# Patient Record
Sex: Female | Born: 1975 | Race: Black or African American | Hispanic: No | Marital: Single | Smoking: Never smoker
Health system: Southern US, Community
[De-identification: ages and names within clinical notes are randomized; demographics above are authoritative.]

## PROBLEM LIST (undated history)

## (undated) DIAGNOSIS — E119 Type 2 diabetes mellitus without complications: Secondary | ICD-10-CM

## (undated) DIAGNOSIS — N289 Disorder of kidney and ureter, unspecified: Secondary | ICD-10-CM

## (undated) DIAGNOSIS — I10 Essential (primary) hypertension: Secondary | ICD-10-CM

## (undated) HISTORY — PX: OTHER SURGICAL HISTORY: SHX169

---

## 2016-07-15 DIAGNOSIS — I10 Essential (primary) hypertension: Secondary | ICD-10-CM | POA: Diagnosis present

## 2016-11-27 DIAGNOSIS — Z992 Dependence on renal dialysis: Secondary | ICD-10-CM

## 2016-11-27 DIAGNOSIS — N186 End stage renal disease: Secondary | ICD-10-CM

## 2017-07-21 DIAGNOSIS — D631 Anemia in chronic kidney disease: Secondary | ICD-10-CM | POA: Diagnosis present

## 2017-07-21 DIAGNOSIS — N189 Chronic kidney disease, unspecified: Secondary | ICD-10-CM | POA: Diagnosis present

## 2018-08-14 ENCOUNTER — Emergency Department (HOSPITAL_COMMUNITY)
Admission: EM | Admit: 2018-08-14 | Discharge: 2018-08-14 | Disposition: A | Discharge status: Home / Self Care | Payer: Self-pay | Attending: Emergency Medicine | Admitting: Emergency Medicine

## 2018-08-14 ENCOUNTER — Encounter (HOSPITAL_COMMUNITY): Payer: Self-pay | Admitting: Obstetrics and Gynecology

## 2018-08-14 ENCOUNTER — Emergency Department (HOSPITAL_COMMUNITY): Payer: Self-pay

## 2018-08-14 ENCOUNTER — Other Ambulatory Visit: Payer: Self-pay

## 2018-08-14 DIAGNOSIS — Y999 Unspecified external cause status: Secondary | ICD-10-CM | POA: Insufficient documentation

## 2018-08-14 DIAGNOSIS — R0789 Other chest pain: Secondary | ICD-10-CM | POA: Insufficient documentation

## 2018-08-14 DIAGNOSIS — Z992 Dependence on renal dialysis: Secondary | ICD-10-CM | POA: Insufficient documentation

## 2018-08-14 DIAGNOSIS — Y939 Activity, unspecified: Secondary | ICD-10-CM | POA: Insufficient documentation

## 2018-08-14 DIAGNOSIS — Y929 Unspecified place or not applicable: Secondary | ICD-10-CM | POA: Insufficient documentation

## 2018-08-14 DIAGNOSIS — E1122 Type 2 diabetes mellitus with diabetic chronic kidney disease: Secondary | ICD-10-CM | POA: Insufficient documentation

## 2018-08-14 DIAGNOSIS — I120 Hypertensive chronic kidney disease with stage 5 chronic kidney disease or end stage renal disease: Secondary | ICD-10-CM | POA: Insufficient documentation

## 2018-08-14 DIAGNOSIS — M25511 Pain in right shoulder: Secondary | ICD-10-CM | POA: Insufficient documentation

## 2018-08-14 DIAGNOSIS — N186 End stage renal disease: Secondary | ICD-10-CM | POA: Insufficient documentation

## 2018-08-14 DIAGNOSIS — M546 Pain in thoracic spine: Secondary | ICD-10-CM | POA: Insufficient documentation

## 2018-08-14 HISTORY — DX: Essential (primary) hypertension: I10

## 2018-08-14 HISTORY — DX: Type 2 diabetes mellitus without complications: E11.9

## 2018-08-14 HISTORY — DX: Disorder of kidney and ureter, unspecified: N28.9

## 2018-08-14 MED ORDER — LIDOCAINE 5 % EX PTCH: 1.0000 | MEDICATED_PATCH | CUTANEOUS | 0 refills | Status: DC

## 2018-08-14 MED ORDER — METHOCARBAMOL 500 MG PO TABS
500.0000 mg | ORAL_TABLET | Freq: Once | ORAL | Status: AC
  Administered 2018-08-14: 500 mg via ORAL
  Filled 2018-08-14: qty 1

## 2018-08-14 MED ORDER — METHOCARBAMOL 500 MG PO TABS: 500.0000 mg | ORAL_TABLET | Freq: Two times a day (BID) | ORAL | 0 refills | Status: DC

## 2018-08-14 MED ORDER — ACETAMINOPHEN 325 MG PO TABS
650.0000 mg | ORAL_TABLET | Freq: Once | ORAL | Status: AC
  Administered 2018-08-14: 650 mg via ORAL
  Filled 2018-08-14: qty 2

## 2018-08-14 MED ORDER — DICLOFENAC SODIUM 1 % TD GEL: 4.0000 g | Freq: Four times a day (QID) | TRANSDERMAL | 0 refills | Status: DC
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
## 2018-08-14 NOTE — Discharge Instructions (Addendum)
Expect your soreness to increase over the next 2-3 days. Take it easy, but do not lay around too much as this may make any stiffness worse.  Acetaminophen: May take acetaminophen (generic for Tylenol), as needed, for pain. Your daily total maximum amount of acetaminophen from all sources should be limited to 4000mg /day for persons without liver problems, or 2000mg /day for those with liver problems. Diclofenac gel: This topical anti-inflammatory medication may be used to provide localized pain relief. Muscle relaxer: Robaxin is a muscle relaxer and may help loosen stiff muscles. Do not take the Robaxin while driving or performing other dangerous activities.  Lidocaine patches: These are available via either prescription or over-the-counter. The over-the-counter option may be more economical one and are likely just as effective. There are multiple over-the-counter brands, such as Salonpas. Exercises: Be sure to perform the attached exercises starting with three times a week and working up to performing them daily. This is an essential part of preventing long term problems.  Follow up: Follow up with a primary care provider for any future management of these complaints. Be sure to follow up within 7-10 days. Return: Return to the ED should symptoms worsen.  For prescription assistance, may try using prescription discount sites or apps, such as goodrx.com

## 2018-10-14 DIAGNOSIS — E662 Morbid (severe) obesity with alveolar hypoventilation: Secondary | ICD-10-CM | POA: Diagnosis present

## 2018-10-14 DIAGNOSIS — G4733 Obstructive sleep apnea (adult) (pediatric): Secondary | ICD-10-CM | POA: Diagnosis present

## 2018-10-14 DIAGNOSIS — E11319 Type 2 diabetes mellitus with unspecified diabetic retinopathy without macular edema: Secondary | ICD-10-CM | POA: Diagnosis present

## 2019-08-19 DIAGNOSIS — E875 Hyperkalemia: Secondary | ICD-10-CM | POA: Diagnosis present

## 2019-09-03 DIAGNOSIS — G9341 Metabolic encephalopathy: Secondary | ICD-10-CM | POA: Diagnosis present

## 2019-09-03 DIAGNOSIS — J9601 Acute respiratory failure with hypoxia: Secondary | ICD-10-CM | POA: Diagnosis present

## 2019-09-03 DIAGNOSIS — J9602 Acute respiratory failure with hypercapnia: Secondary | ICD-10-CM | POA: Diagnosis present

## 2020-03-18 ENCOUNTER — Emergency Department (HOSPITAL_COMMUNITY)
Admission: EM | Admit: 2020-03-18 | Discharge: 2020-03-18 | Disposition: A | Discharge status: Home / Self Care | Payer: BC Managed Care – PPO | Attending: Emergency Medicine | Admitting: Emergency Medicine

## 2020-03-18 ENCOUNTER — Emergency Department (HOSPITAL_COMMUNITY): Payer: BC Managed Care – PPO

## 2020-03-18 ENCOUNTER — Other Ambulatory Visit: Payer: Self-pay

## 2020-03-18 DIAGNOSIS — I120 Hypertensive chronic kidney disease with stage 5 chronic kidney disease or end stage renal disease: Secondary | ICD-10-CM | POA: Diagnosis not present

## 2020-03-18 DIAGNOSIS — E1122 Type 2 diabetes mellitus with diabetic chronic kidney disease: Secondary | ICD-10-CM | POA: Insufficient documentation

## 2020-03-18 DIAGNOSIS — Z992 Dependence on renal dialysis: Secondary | ICD-10-CM | POA: Diagnosis not present

## 2020-03-18 DIAGNOSIS — Z79899 Other long term (current) drug therapy: Secondary | ICD-10-CM | POA: Diagnosis not present

## 2020-03-18 DIAGNOSIS — Z794 Long term (current) use of insulin: Secondary | ICD-10-CM | POA: Diagnosis not present

## 2020-03-18 DIAGNOSIS — R0789 Other chest pain: Secondary | ICD-10-CM | POA: Diagnosis present

## 2020-03-18 DIAGNOSIS — N186 End stage renal disease: Secondary | ICD-10-CM | POA: Diagnosis not present

## 2020-03-18 DIAGNOSIS — R079 Chest pain, unspecified: Secondary | ICD-10-CM

## 2020-03-18 LAB — I-STAT CHEM 8, ED
BUN: 54 mg/dL — ABNORMAL HIGH (ref 6–20)
Calcium, Ion: 1.06 mmol/L — ABNORMAL LOW (ref 1.15–1.40)
Chloride: 99 mmol/L (ref 98–111)
Creatinine, Ser: 5.8 mg/dL — ABNORMAL HIGH (ref 0.44–1.00)
Glucose, Bld: 120 mg/dL — ABNORMAL HIGH (ref 70–99)
HCT: 42 % (ref 36.0–46.0)
Hemoglobin: 14.3 g/dL (ref 12.0–15.0)
Potassium: 5 mmol/L (ref 3.5–5.1)
Sodium: 140 mmol/L (ref 135–145)
TCO2: 32 mmol/L (ref 22–32)

## 2020-03-18 LAB — CBC WITH DIFFERENTIAL/PLATELET
Abs Immature Granulocytes: 0.04 10*3/uL (ref 0.00–0.07)
Basophils Absolute: 0.1 10*3/uL (ref 0.0–0.1)
Basophils Relative: 1 %
Eosinophils Absolute: 0.2 10*3/uL (ref 0.0–0.5)
Eosinophils Relative: 2 %
HCT: 40.7 % (ref 36.0–46.0)
Hemoglobin: 11.9 g/dL — ABNORMAL LOW (ref 12.0–15.0)
Immature Granulocytes: 1 %
Lymphocytes Relative: 15 %
Lymphs Abs: 1.3 10*3/uL (ref 0.7–4.0)
MCH: 27.4 pg (ref 26.0–34.0)
MCHC: 29.2 g/dL — ABNORMAL LOW (ref 30.0–36.0)
MCV: 93.8 fL (ref 80.0–100.0)
Monocytes Absolute: 0.5 10*3/uL (ref 0.1–1.0)
Monocytes Relative: 6 %
Neutro Abs: 6.7 10*3/uL (ref 1.7–7.7)
Neutrophils Relative %: 75 %
Platelets: 241 10*3/uL (ref 150–400)
RBC: 4.34 MIL/uL (ref 3.87–5.11)
RDW: 16.7 % — ABNORMAL HIGH (ref 11.5–15.5)
WBC: 8.9 10*3/uL (ref 4.0–10.5)
nRBC: 0 % (ref 0.0–0.2)

## 2020-03-18 LAB — COMPREHENSIVE METABOLIC PANEL
ALT: 18 U/L (ref 0–44)
AST: 28 U/L (ref 15–41)
Albumin: 4.2 g/dL (ref 3.5–5.0)
Alkaline Phosphatase: 94 U/L (ref 38–126)
Anion gap: 15 (ref 5–15)
BUN: 39 mg/dL — ABNORMAL HIGH (ref 6–20)
CO2: 28 mmol/L (ref 22–32)
Calcium: 8.7 mg/dL — ABNORMAL LOW (ref 8.9–10.3)
Chloride: 97 mmol/L — ABNORMAL LOW (ref 98–111)
Creatinine, Ser: 5.6 mg/dL — ABNORMAL HIGH (ref 0.44–1.00)
GFR calc Af Amer: 10 mL/min — ABNORMAL LOW (ref >60)
GFR calc non Af Amer: 8 mL/min — ABNORMAL LOW (ref >60)
Glucose, Bld: 123 mg/dL — ABNORMAL HIGH (ref 70–99)
Potassium: 4.7 mmol/L (ref 3.5–5.1)
Sodium: 140 mmol/L (ref 135–145)
Total Bilirubin: 0.6 mg/dL (ref 0.3–1.2)
Total Protein: 8.6 g/dL — ABNORMAL HIGH (ref 6.5–8.1)

## 2020-03-18 LAB — TROPONIN I (HIGH SENSITIVITY)
Troponin I (High Sensitivity): 8 ng/L (ref <18)
Troponin I (High Sensitivity): 9 ng/L (ref <18)

## 2020-03-18 MED ORDER — ASPIRIN 325 MG PO TABS
325.0000 mg | ORAL_TABLET | Freq: Once | ORAL | Status: AC
  Administered 2020-03-18: 325 mg via ORAL
  Filled 2020-03-18: qty 1

## 2020-03-18 MED ORDER — SODIUM CHLORIDE 0.9 % IV BOLUS
500.0000 mL | Freq: Once | INTRAVENOUS | Status: AC
  Administered 2020-03-18: 500 mL via INTRAVENOUS

## 2020-03-18 NOTE — Discharge Instructions (Signed)
Take Tylenol as needed for chest pain   You may have a reaction to iron infusion   Your heart enzymes are normal right now   See your doctor  Return to ER if you have worse chest pain, trouble breathing, fever.

## 2020-03-18 NOTE — ED Notes (Signed)
ED Provider at bedside. 

## 2020-03-18 NOTE — ED Triage Notes (Signed)
Per patient she developed chest pain starting 1 hour ago after dialysis. Pain is 10/10

## 2020-03-18 NOTE — ED Provider Notes (Signed)
[facility] DEPT Provider Note   CSN: [id number] Arrival date & time: 03/18/20  1708     History Chief Complaint  Patient presents with  . Chest Pain    Autumn Mccormick is a 44 y.o. female hx of DM, HTN, ESRD on dialysis here presenting with chest pain.  Patient just finished dialysis prior to arrival.  She states that she also received iron transfusion during that time.  Patient states that she got home around 4 PM and around 430, she had left-sided chest pain.  States that the pain is constant and did not go away so she came to the ER .  Patient was noted to be hypotensive in triage.  Patient denies any fevers or chills or cough.  Patient denies any history of MIs in the past.  She states that she had family history of MI.   The history is provided by the patient.       Past Medical History:  Diagnosis Date  . Diabetes mellitus without complication ([place])   . Hypertension   . Renal disorder     There are no problems to display for this patient.   Past Surgical History:  Procedure Laterality Date  . kidney stent Right      OB History    Gravida      Para      Term      Preterm      AB      Living  1     SAB      TAB      Ectopic      Multiple      Live Births              No family history on file.  Social History   Tobacco Use  . Smoking status: Never Smoker  . Smokeless tobacco: Never Used  Substance Use Topics  . Alcohol use: Yes    Comment: Socially  . Drug use: Yes    Types: Marijuana    Home Medications Prior to Admission medications   Medication Sig Start Date End Date Taking? Authorizing Provider  amitriptyline (ELAVIL) 25 MG tablet Take 25 mg by mouth at bedtime. 01/07/20  Yes [provider]  atorvastatin (LIPITOR) 20 MG tablet Take 20 mg by mouth every evening.  01/07/20  Yes [provider]  carvedilol (COREG) 25 MG tablet Take 25 mg by mouth 2 (two) times daily. 03/08/20  Yes  [provider]  glipiZIDE (GLUCOTROL XL) 5 MG 24 hr tablet Take 5 mg by mouth daily. 01/07/20  Yes [provider]  LEVEMIR FLEXTOUCH 100 UNIT/ML FlexPen Inject 40 Units into the skin at bedtime.  03/07/20  Yes [provider]  losartan (COZAAR) 50 MG tablet Take 50 mg by mouth daily. 01/07/20  Yes [provider]  Multiple Vitamins-Minerals (MULTIVITAMIN ADULTS PO) Take 1 tablet by mouth every evening.   Yes [provider]  omeprazole (PRILOSEC) 20 MG capsule Take 20 mg by mouth daily as needed (indigestion).  03/07/20  Yes [provider]  sevelamer carbonate (RENVELA) 800 MG tablet Take 1,600 mg by mouth 3 (three) times daily with meals.  02/23/20  Yes [provider]  diclofenac sodium (VOLTAREN) 1 % GEL Apply 4 g topically 4 (four) times daily. Patient not taking: Reported on 03/18/2020 08/14/18   Joy, Raquel Sarna C, PA-C  lidocaine (LIDODERM) 5 % Place 1 patch onto the skin daily. Remove & Discard patch within  12 hours or as directed by MD Patient not taking: Reported on 03/18/2020 08/14/18   Joy, Helane Gunther, PA-C  methocarbamol (ROBAXIN) 500 MG tablet Take 1 tablet (500 mg total) by mouth 2 (two) times daily. Patient not taking: Reported on 03/18/2020 08/14/18   Lorayne Bender, PA-C    Allergies    Augmentin [amoxicillin-pot clavulanate] and Metformin and related  Review of Systems   Review of Systems  Cardiovascular: Positive for chest pain.  All other systems reviewed and are negative.   Physical Exam Updated Vital Signs BP 125/69   Pulse 71   Temp 98.2 F (36.8 C) (Oral)   Resp 15   Ht 5\' 5"  (1.651 m)   Wt (!) 146.1 kg   SpO2 100%   BMI 53.58 kg/m   Physical Exam Vitals and nursing note reviewed.  HENT:     Head: Normocephalic.  Eyes:     Extraocular Movements: Extraocular movements intact.     Pupils: Pupils are equal, round, and reactive to light.  Cardiovascular:     Rate and Rhythm: Normal rate and regular rhythm.      Heart sounds: Normal heart sounds.  Pulmonary:     Effort: Pulmonary effort is normal.  Abdominal:     General: Bowel sounds are normal.     Palpations: Abdomen is soft.  Musculoskeletal:        General: Normal range of motion.     Cervical back: Normal range of motion and neck supple.  Skin:    General: Skin is warm.     Capillary Refill: Capillary refill takes less than 2 seconds.  Neurological:     General: No focal deficit present.     Mental Status: She is alert and oriented to person, place, and time.  Psychiatric:        Mood and Affect: Mood normal.        Behavior: Behavior normal.     ED Results / Procedures / Treatments   Labs (all labs ordered are listed, but only abnormal results are displayed) Labs Reviewed  CBC WITH DIFFERENTIAL/PLATELET - Abnormal; Notable for the following components:      Result Value   Hemoglobin 11.9 (*)    MCHC 29.2 (*)    RDW 16.7 (*)    All other components within normal limits  COMPREHENSIVE METABOLIC PANEL - Abnormal; Notable for the following components:   Chloride 97 (*)    Glucose, Bld 123 (*)    BUN 39 (*)    Creatinine, Ser 5.60 (*)    Calcium 8.7 (*)    Total Protein 8.6 (*)    GFR calc non Af Amer 8 (*)    GFR calc Af Amer 10 (*)    All other components within normal limits  I-STAT CHEM 8, ED - Abnormal; Notable for the following components:   BUN 54 (*)    Creatinine, Ser 5.80 (*)    Glucose, Bld 120 (*)    Calcium, Ion 1.06 (*)    All other components within normal limits  TROPONIN I (HIGH SENSITIVITY)  TROPONIN I (HIGH SENSITIVITY)    EKG EKG Interpretation  Date/Time:  Saturday March 18 2020 17:28:34 EDT Ventricular Rate:  74 PR Interval:    QRS Duration: 81 QT Interval:  412 QTC Calculation: 458 R Axis:   74 Text Interpretation: Sinus rhythm Low voltage, extremity and precordial leads ST elev, probable normal early repol pattern 12 Lead; [place] No previous ECGs available Confirmed by Darl Householder,  Fenton Malling  ([id number]) on 03/18/2020 6:07:53 PM   Radiology DG Chest Port 1 View  Result Date: 03/18/2020 CLINICAL DATA:  Left upper quadrant pain and chest pain. EXAM: PORTABLE CHEST 1 VIEW COMPARISON:  September 02, 2019 FINDINGS: Very mild linear atelectasis is seen along the lateral aspect of the left lung base. There is no evidence of acute infiltrate, pleural effusion or pneumothorax. The cardiac silhouette is mildly enlarged and unchanged in size. The visualized skeletal structures are unremarkable. IMPRESSION: No active disease. Electronically Signed   By: Virgina Norfolk M.D.   On: 03/18/2020 18:50    Procedures Procedures (including critical care time)  Medications Ordered in ED Medications  sodium chloride 0.9 % bolus 500 mL (0 mLs Intravenous Stopped 03/18/20 1947)  aspirin tablet 325 mg (325 mg Oral Given 03/18/20 1909)    ED Course  I have reviewed the triage vital signs and the nursing notes.  Pertinent labs & imaging results that were available during my care of the patient were reviewed by me and considered in my medical decision making (see chart for details).    MDM Rules/Calculators/A&P                      Autumn Mccormick is a 44 y.o. female who presenting with chest pain.  Patient is a dialysis patient and just finished dialysis.  Patient was hypotensive in triage and that is because she just had dialysis.  Patient has no personal history of CAD but has family member who had MI in the past.  Will get CBC, CMP, troponin x2, chest x-ray. I doubt PE or dissection.  9:41 PM Labs at baseline. Trop neg x 2. BP improved to 125/69.  Stable for discharge at this point.  Final Clinical Impression(s) / ED Diagnoses Final diagnoses:  None    Rx / DC Orders ED Discharge Orders    None       Drenda Freeze, MD 03/18/20 2142

## 2021-03-02 ENCOUNTER — Emergency Department (HOSPITAL_COMMUNITY)
Admission: EM | Admit: 2021-03-02 | Discharge: 2021-03-02 | Disposition: A | Discharge status: Home / Self Care | Payer: Medicare Other | Source: Home / Self Care | Attending: Emergency Medicine | Admitting: Emergency Medicine

## 2021-03-02 ENCOUNTER — Emergency Department (HOSPITAL_COMMUNITY): Payer: Medicare Other

## 2021-03-02 ENCOUNTER — Other Ambulatory Visit: Payer: Self-pay

## 2021-03-02 DIAGNOSIS — N3000 Acute cystitis without hematuria: Secondary | ICD-10-CM

## 2021-03-02 DIAGNOSIS — E875 Hyperkalemia: Secondary | ICD-10-CM | POA: Diagnosis not present

## 2021-03-02 DIAGNOSIS — R1032 Left lower quadrant pain: Secondary | ICD-10-CM | POA: Insufficient documentation

## 2021-03-02 DIAGNOSIS — G9341 Metabolic encephalopathy: Secondary | ICD-10-CM | POA: Diagnosis not present

## 2021-03-02 DIAGNOSIS — R110 Nausea: Secondary | ICD-10-CM | POA: Insufficient documentation

## 2021-03-02 DIAGNOSIS — N12 Tubulo-interstitial nephritis, not specified as acute or chronic: Secondary | ICD-10-CM

## 2021-03-02 DIAGNOSIS — L0291 Cutaneous abscess, unspecified: Secondary | ICD-10-CM

## 2021-03-02 LAB — CBC WITH DIFFERENTIAL/PLATELET
Abs Immature Granulocytes: 0.04 10*3/uL (ref 0.00–0.07)
Basophils Absolute: 0 10*3/uL (ref 0.0–0.1)
Basophils Relative: 0 %
Eosinophils Absolute: 0.1 10*3/uL (ref 0.0–0.5)
Eosinophils Relative: 1 %
HCT: 37.9 % (ref 36.0–46.0)
Hemoglobin: 11.2 g/dL — ABNORMAL LOW (ref 12.0–15.0)
Immature Granulocytes: 0 %
Lymphocytes Relative: 8 %
Lymphs Abs: 0.7 10*3/uL (ref 0.7–4.0)
MCH: 27.3 pg (ref 26.0–34.0)
MCHC: 29.6 g/dL — ABNORMAL LOW (ref 30.0–36.0)
MCV: 92.4 fL (ref 80.0–100.0)
Monocytes Absolute: 0.5 10*3/uL (ref 0.1–1.0)
Monocytes Relative: 5 %
Neutro Abs: 8.3 10*3/uL — ABNORMAL HIGH (ref 1.7–7.7)
Neutrophils Relative %: 86 %
Platelets: 244 10*3/uL (ref 150–400)
RBC: 4.1 MIL/uL (ref 3.87–5.11)
RDW: 16.1 % — ABNORMAL HIGH (ref 11.5–15.5)
WBC: 9.7 10*3/uL (ref 4.0–10.5)
nRBC: 0 % (ref 0.0–0.2)

## 2021-03-02 LAB — COMPREHENSIVE METABOLIC PANEL
ALT: 15 U/L (ref 0–44)
AST: 16 U/L (ref 15–41)
Albumin: 3.8 g/dL (ref 3.5–5.0)
Alkaline Phosphatase: 80 U/L (ref 38–126)
Anion gap: 14 (ref 5–15)
BUN: 93 mg/dL — ABNORMAL HIGH (ref 6–20)
CO2: 27 mmol/L (ref 22–32)
Calcium: 9.1 mg/dL (ref 8.9–10.3)
Chloride: 97 mmol/L — ABNORMAL LOW (ref 98–111)
Creatinine, Ser: 10.73 mg/dL — ABNORMAL HIGH (ref 0.44–1.00)
GFR, Estimated: 4 mL/min — ABNORMAL LOW (ref >60)
Glucose, Bld: 168 mg/dL — ABNORMAL HIGH (ref 70–99)
Potassium: 5.1 mmol/L (ref 3.5–5.1)
Sodium: 138 mmol/L (ref 135–145)
Total Bilirubin: 0.3 mg/dL (ref 0.3–1.2)
Total Protein: 8.1 g/dL (ref 6.5–8.1)

## 2021-03-02 LAB — URINALYSIS, ROUTINE W REFLEX MICROSCOPIC
Bilirubin Urine: NEGATIVE
Glucose, UA: 50 mg/dL — AB
Ketones, ur: NEGATIVE mg/dL
Nitrite: NEGATIVE
Protein, ur: 100 mg/dL — AB
Specific Gravity, Urine: 1.01 (ref 1.005–1.030)
WBC, UA: >50 WBC/hpf — ABNORMAL HIGH (ref 0–5)
pH: 7 (ref 5.0–8.0)

## 2021-03-02 LAB — LIPASE, BLOOD: Lipase: 39 U/L (ref 11–51)

## 2021-03-02 MED ORDER — CEFIXIME 100 MG/5ML PO SUSR: 200.0000 mg | Freq: Every day | ORAL | 0 refills | Status: DC

## 2021-03-02 MED ORDER — LABETALOL HCL 5 MG/ML IV SOLN
10.0000 mg | Freq: Once | INTRAVENOUS | Status: AC
  Administered 2021-03-02: 10 mg via INTRAVENOUS
  Filled 2021-03-02: qty 4

## 2021-03-02 MED ORDER — ONDANSETRON 4 MG PO TBDP: 4.0000 mg | ORAL_TABLET | Freq: Three times a day (TID) | ORAL | 0 refills | Status: AC | PRN

## 2021-03-02 MED ORDER — ONDANSETRON HCL 4 MG/2ML IJ SOLN
4.0000 mg | Freq: Once | INTRAMUSCULAR | Status: AC
  Administered 2021-03-02: 4 mg via INTRAVENOUS
  Filled 2021-03-02: qty 2

## 2021-03-02 MED ORDER — HYDROCODONE-ACETAMINOPHEN 5-325 MG PO TABS
2.0000 | ORAL_TABLET | Freq: Once | ORAL | Status: AC
  Administered 2021-03-02: 2 via ORAL
  Filled 2021-03-02: qty 2

## 2021-03-02 MED ORDER — MORPHINE SULFATE (PF) 4 MG/ML IV SOLN
4.0000 mg | Freq: Once | INTRAVENOUS | Status: AC
  Administered 2021-03-02: 4 mg via INTRAVENOUS
  Filled 2021-03-02: qty 1

## 2021-03-02 MED ORDER — SODIUM CHLORIDE 0.9 % IV SOLN
2.0000 g | Freq: Once | INTRAVENOUS | Status: AC
  Administered 2021-03-02: 2 g via INTRAVENOUS
  Filled 2021-03-02: qty 20

## 2021-03-02 MED ORDER — HYDROCODONE-ACETAMINOPHEN 5-325 MG PO TABS: 1.0000 | ORAL_TABLET | Freq: Four times a day (QID) | ORAL | 0 refills | Status: DC | PRN

## 2021-03-02 NOTE — ED Provider Notes (Signed)
I provided a substantive portion of the care of this patient.  I personally performed the entirety of the exam for this encounter.    Patient is Tuesday Thursday Saturday dialysis but makes urine.  Patient reports she had a pretty sudden onset of a sharp pain in her left very lower abdomen that started in the morning.  She reports she has never had a kidney stone.  She indicates the pain is right along her inguinal lower abdominal area.  She reports is really sharp and persistent.  No radiation into the leg.  No weakness or numbness into the leg.  She did get nauseated and threw up 3 times.  No diarrhea.  No fever.  Patient is being evaluated for peritoneal dialysis access but has not had any intra-abdominal instrumentation.  He does have a formed and mature fistula in the left upper arm.  Patient denies prior history of similar pain.  Patient reports she has had a hysterectomy but does have her ovaries still.  She denies prior history of known ovarian cysts or other complications.  Patient is alert appropriate no respiratory distress.  Lungs are clear.  Heart regular.  Patient endorses discomfort to palpation in the very lower left abdomen down to about the suprapubic area.  No upper tenderness.  No central abdominal pain.  No rashes or skin changes.  No weakness of the lower extremities.  No swelling of the lower extremities.  Agree with proceeding with CT scan to rule out kidney stone or other potential etiologies such as diverticulitis or ovarian cyst.  CT does not show acute surgical issues.  It does suggest slight fullness of the left collecting system without an obvious obstruction..  Agree with proceeding to ultrasound to assess for possible ovarian torsion although she does not have uterus.  Very low possibility for PID or STD as etiology of pain.  No specific findings on ultrasound.  At this time urinalysis is suggestive of UTI.  Patient does continue to make urine several times a day.  CT shows a  little bit of fullness which may suggest either early pyelonephritis or possibly recently passed stone.  At this time agree with plan to proceed with antibiotics and close follow-up.   Charlesetta Shanks, MD 03/03/21 1406

## 2021-03-02 NOTE — ED Triage Notes (Signed)
Brought in by EMS from home c/o LLQ abdominal pain started last night. Pt took tylenol and gas relief but no relief. Pt report nausea and emesis x3 since last night. Pt a/ox4.   BP 140 87 HR 90 RR 20 Temp 98.7

## 2021-03-02 NOTE — ED Notes (Signed)
PT O2 low Nurse made aware

## 2021-03-02 NOTE — ED Provider Notes (Signed)
[facility] DEPT Provider Note   CSN: [id number] Arrival date & time: 03/02/21  [id number]     History Chief Complaint  Patient presents with  . Abdominal Pain    Autumn Mccormick is a 45 y.o. female.  HPI   Patient presents with left flank pain x 6 hours. States it occurred suddenly, pain is sharp and constant. Does not radiate. Is worsened by movement and pressure. Is not alleviated by tylenol or gas x. She has had pain like this before and it was determined to be gas. Associated with nausea and vomiting (3x, no hematemesis). No dysuria, no hematuria, no fever. No history of kidney stones. She is on dialysis T, TH, S. She still makes urine 3x daily. She had an appointment to consider at home dialysis but has not had a catheter placed. Her fistula is in the left arm.   She notes she has had an abscess under her left breast x 1 week. It has been draining pus and blood.   Past Medical History:  Diagnosis Date  . Diabetes mellitus without complication ([place])   . Hypertension   . Renal disorder     There are no problems to display for this patient.   Past Surgical History:  Procedure Laterality Date  . kidney stent Right      OB History    Gravida      Para      Term      Preterm      AB      Living  1     SAB      IAB      Ectopic      Multiple      Live Births              No family history on file.  Social History   Tobacco Use  . Smoking status: Never Smoker  . Smokeless tobacco: Never Used  Vaping Use  . Vaping Use: Never used  Substance Use Topics  . Alcohol use: Yes    Comment: Socially  . Drug use: Yes    Types: Marijuana    Home Medications Prior to Admission medications   Medication Sig Start Date End Date Taking? Authorizing Provider  amitriptyline (ELAVIL) 25 MG tablet Take 25 mg by mouth at bedtime. 01/07/20  Yes [provider]  atorvastatin (LIPITOR) 20 MG tablet Take 20 mg by mouth every  evening.  01/07/20  Yes [provider]  carvedilol (COREG) 25 MG tablet Take 25 mg by mouth 2 (two) times daily. 03/08/20  Yes [provider]  insulin glargine (LANTUS SOLOSTAR) 100 UNIT/ML Solostar Pen Inject 40 Units into the skin every evening. 02/21/21  Yes [provider]  LEVEMIR FLEXTOUCH 100 UNIT/ML FlexPen Inject 40 Units into the skin at bedtime.  03/07/20  Yes [provider]  losartan (COZAAR) 50 MG tablet Take 50 mg by mouth daily. 01/07/20  Yes [provider]  Multiple Vitamins-Minerals (MULTIVITAMIN ADULTS PO) Take 1 tablet by mouth every evening.   Yes [provider]  omeprazole (PRILOSEC) 20 MG capsule Take 20 mg by mouth daily as needed (indigestion).  03/07/20  Yes [provider]  sevelamer carbonate (RENVELA) 800 MG tablet Take 1,600 mg by mouth 3 (three) times daily with meals.  02/23/20  Yes [provider]  calcium acetate (PHOSLO) 667 MG capsule Take 1,337 mg by mouth with breakfast, with lunch, and with evening meal. 02/26/21  [provider]  diclofenac sodium (VOLTAREN) 1 % GEL Apply 4 g topically 4 (four) times daily. Patient not taking: Reported on 03/18/2020 08/14/18   Joy, Raquel Sarna C, PA-C  glipiZIDE (GLUCOTROL XL) 5 MG 24 hr tablet Take 5 mg by mouth daily. 01/07/20   [provider]  lidocaine (LIDODERM) 5 % Place 1 patch onto the skin daily. Remove & Discard patch within 12 hours or as directed by MD Patient not taking: Reported on 03/18/2020 08/14/18   Joy, Helane Gunther, PA-C  methocarbamol (ROBAXIN) 500 MG tablet Take 1 tablet (500 mg total) by mouth 2 (two) times daily. Patient not taking: Reported on 03/18/2020 08/14/18   Lorayne Bender, PA-C    Allergies    Augmentin [amoxicillin-pot clavulanate] and Metformin and related  Review of Systems   Review of Systems  Constitutional: Negative for chills and fever.  HENT: Negative for ear pain and sore throat.   Eyes: Negative for pain and  visual disturbance.  Respiratory: Negative for cough and shortness of breath.   Cardiovascular: Negative for chest pain and palpitations.  Gastrointestinal: Positive for abdominal pain, nausea and vomiting.  Genitourinary: Negative for dysuria and hematuria.  Musculoskeletal: Negative for arthralgias and back pain.  Skin: Negative for color change and rash.  Neurological: Negative for seizures and syncope.  All other systems reviewed and are negative.   Physical Exam Updated Vital Signs BP (!) 187/97   Pulse 68   Temp 98.6 F (37 C) (Oral)   Resp 11   SpO2 96%   Physical Exam Vitals and nursing note reviewed. Exam conducted with a chaperone present.  Constitutional:      General: She is in acute distress.     Appearance: Normal appearance. She is obese.     Comments: Patient is moaning in pain. Uncomfortable appearing.   HENT:     Head: Normocephalic and atraumatic.  Eyes:     General: No scleral icterus.       Right eye: No discharge.        Left eye: No discharge.     Extraocular Movements: Extraocular movements intact.     Pupils: Pupils are equal, round, and reactive to light.  Cardiovascular:     Rate and Rhythm: Normal rate and regular rhythm.     Pulses: Normal pulses.     Heart sounds: Normal heart sounds. No murmur heard. No friction rub. No gallop.   Pulmonary:     Effort: Pulmonary effort is normal. No respiratory distress.     Breath sounds: Normal breath sounds.     Comments: Speaking in complete sentences. Decreased breast sounds bilaterally secondary to body habitus.  Abdominal:     General: Abdomen is flat. Bowel sounds are normal. There is no distension.     Palpations: Abdomen is soft. There is no shifting dullness.     Tenderness: There is abdominal tenderness in the left upper quadrant and left lower quadrant. There is left CVA tenderness.  Musculoskeletal:     Comments: Fistula present to left arm. No erythema, fluctuance, swelling or pain on  palpation.  Skin:    General: Skin is warm and dry.     Coloration: Skin is not jaundiced.     Comments: Palpable area of induration and abscess appreciated under left breast. There is obvious sanguinous fluid and site of draining. Not erythematous. No cellulitis.   Neurological:     Mental Status: She is alert. Mental status is at baseline.  Coordination: Coordination normal.     ED Results / Procedures / Treatments   Labs (all labs ordered are listed, but only abnormal results are displayed) Labs Reviewed  CBC WITH DIFFERENTIAL/PLATELET - Abnormal; Notable for the following components:      Result Value   Hemoglobin 11.2 (*)    MCHC 29.6 (*)    RDW 16.1 (*)    Neutro Abs 8.3 (*)    All other components within normal limits  COMPREHENSIVE METABOLIC PANEL - Abnormal; Notable for the following components:   Chloride 97 (*)    Glucose, Bld 168 (*)    BUN 93 (*)    Creatinine, Ser 10.73 (*)    GFR, Estimated 4 (*)    All other components within normal limits  LIPASE, BLOOD  URINALYSIS, ROUTINE W REFLEX MICROSCOPIC    EKG None  Radiology CT Renal Stone Study  Result Date: 03/02/2021 CLINICAL DATA:  Left lower quadrant and flank pain. EXAM: CT ABDOMEN AND PELVIS WITHOUT CONTRAST TECHNIQUE: Multidetector CT imaging of the abdomen and pelvis was performed following the standard protocol without IV contrast. COMPARISON:  08/08/2018 FINDINGS: Lower chest: 4 mm right middle lobe pulmonary nodule identified on image 2 of series 4. Hepatobiliary: The liver shows diffusely decreased attenuation suggesting fat deposition. No focal abnormality in the liver on this study without intravenous contrast. Gallbladder is surgically absent. No intrahepatic or extrahepatic biliary dilation. Pancreas: No focal mass lesion. No dilatation of the main duct. No intraparenchymal cyst. No peripancreatic edema. Spleen: No splenomegaly. No focal mass lesion. Adrenals/Urinary Tract: No adrenal nodule or  mass. No stones are seen in the left kidney or ureter. Mild fullness of the left intrarenal collecting system and proximal ureter evident without identifiable obstructive etiology. No stones are seen in the right kidney with a double-J right internal ureteral stent device again noted. Bladder is nondistended. Stomach/Bowel: Stomach is unremarkable. No gastric wall thickening. No evidence of outlet obstruction. Duodenum is normally positioned as is the ligament of Treitz. No small bowel wall thickening. No small bowel dilatation. The terminal ileum is normal. The appendix is not well visualized, but there is no edema or inflammation in the region of the cecum. No gross colonic mass. No colonic wall thickening. Vascular/Lymphatic: There is abdominal aortic atherosclerosis without aneurysm. There is no gastrohepatic or hepatoduodenal ligament lymphadenopathy. No retroperitoneal or mesenteric lymphadenopathy. No pelvic sidewall lymphadenopathy. Reproductive: Uterus surgically absent.  There is no adnexal mass. Other: No intraperitoneal free fluid. Musculoskeletal: Small to moderate paraumbilical hernia contains only fat. No worrisome lytic or sclerotic osseous abnormality. IMPRESSION: 1. No acute findings in the abdomen or pelvis. Specifically, no findings to explain the patient's history of left lower quadrant pain. 2. Double-J right internal ureteral stent device in situ. 3. Mild fullness of the left intrarenal collecting system and proximal ureter without identifiable obstructive etiology. Correlation with renal function testing recommended in urology follow-up likely warranted. 4. Hepatic steatosis. 5. Small to moderate paraumbilical hernia contains only fat. 6. Aortic Atherosclerosis (ICD10-I70.0). Electronically Signed   By: Misty Stanley M.D.   On: 03/02/2021 08:19    Procedures Procedures   Medications Ordered in ED Medications  ondansetron (ZOFRAN) injection 4 mg (4 mg Intravenous Given 03/02/21 0725)   morphine 4 MG/ML injection 4 mg (4 mg Intravenous Given 03/02/21 0725)  ondansetron (ZOFRAN) injection 4 mg (4 mg Intravenous Given 03/02/21 0923)  morphine 4 MG/ML injection 4 mg (4 mg Intravenous Given 03/02/21 [id number])    ED Course  I  have reviewed the triage vital signs and the nursing notes.  Pertinent labs & imaging results that were available during my care of the patient were reviewed by me and considered in my medical decision making (see chart for details).  Clinical Course as of 03/02/21 1027  Fri Mar 02, 2021  [id number] Creatinine(!): 10.73 Last incidence in care eveywhere showed in February Cr was 9.3. Don't think this elevation is due to urinary pain.  [HS]    Clinical Course User Index [HS] Sherrill Raring, PA-C   MDM Rules/Calculators/A&P                          Patient is a 45yo female with history of CKD on dialysis T,TH, S presents with left flank pain. +CVAT left. Left lower quadrant TTP. High suspicion for kidney stone. CT scan ordered.   Also on the ddx is diverticulitis, gastritis, pancreatitis, pyelo, UTI, ovarian cyst, Ectopic pregnancy. Low suspicion for mesenteric given age and lack of coagulopathy.  However, patient's pain seems out of proportion to exam findings.   Vitals were delayed. Think the initial vitals showed an O2 of 87%. I do not believe that number is correct. Repeat vitals showed Sp02 of 96%.   Will check EKG for QT prolongation. If normal will give Zofran. EKG without signs of QT prolongation CBC: no elevated WBC concerning for infection. Mildly lowered hemoglobin to 11.2 Not concerning for GIB given lack of bloody stool/hematemesis.  Lipase not concerning for pancreatitis.  CMP: No electrolyte derangement. See ED course and below regarding patient's creatinine.  See ED course for CT renal results.  UA: Findings consistent with UTI. Will start on antibiotics outpatient.  [place] without signs of ovarian mass, although limited due to body habitus.   It is  possible that she had a stone and passed it. Although the CT scan was without findings of diverticulitis, it is possible that it was missed given the lack of contrast. Suspect that her pain is secondary to pyelonephritis. Consulted with pharmacy due to her kidney function and advised to prescribe cefixime x 10 days.  Abscess present without signs of cellulits. already drained. I don't think she requires antibiotics specific for this given lack of cellulitis.   Discussed HPI, physical exam and plan of care for this patient with attending Charlesetta Shanks. The attending physician evaluated this patient as part of a shared visit and agrees with plan of care.  BUN  Date Value Ref Range Status  03/02/2021 93 (H) 6 - 20 mg/dL Final  03/18/2020 54 (H) 6 - 20 mg/dL Final  03/18/2020 39 (H) 6 - 20 mg/dL Final   Creatinine, Ser  Date Value Ref Range Status  03/02/2021 10.73 (H) 0.44 - 1.00 mg/dL Final  03/18/2020 5.80 (H) 0.44 - 1.00 mg/dL Final  03/18/2020 5.60 (H) 0.44 - 1.00 mg/dL Final    Final Clinical Impression(s) / ED Diagnoses Final diagnoses:  None    Rx / DC Orders ED Discharge Orders    None       Sherrill Raring, PA-C 03/09/21 1424    Charlesetta Shanks, MD 03/19/21 1010

## 2021-03-02 NOTE — Discharge Instructions (Addendum)
You were seen today for left flank pain. You were diagnosed with pyelonephritis, which is a kidney infection. To treat this please take cefixime once daily for the next 10 days. On days you have dialysis, take it after finishing the dialysis    Acetaminophen: May take acetaminophen (generic for Tylenol), as needed, for pain. Your daily total maximum amount of acetaminophen from all sources should be limited to '4000mg'$ /day for persons without liver problems, or '2000mg'$ /day for those with liver problems. Vicodin: May take Vicodin (hydrocodone-acetaminophen) as needed for severe pain.   Do not drive or perform other dangerous activities while taking this medication as it can cause drowsiness as well as changes in reaction time and judgement.   Please note that each pill of Vicodin contains 325 mg of acetaminophen (generic for Tylenol) and the above dosage limits apply. Nausea/vomiting: Use the ondansetron (generic for Zofran) for nausea or vomiting.  This medication may not prevent all vomiting or nausea, but can help facilitate better hydration. Things that can help with nausea/vomiting also include peppermint/menthol candies, vitamin B12, and ginger. Follow up: Follow-up with your primary care provider and urology on this matter.  Call to make an appointment.

## 2021-03-02 NOTE — ED Provider Notes (Signed)
I saw this patient in partnership with colleague Sherrill Raring, PA-C.  In short, she endorses left flank and abdominal pain beginning today.   Physical Exam Vitals and nursing note reviewed.  Constitutional:      General: She is not in acute distress.    Appearance: She is well-developed. She is obese. She is not diaphoretic.  HENT:     Head: Normocephalic and atraumatic.     Mouth/Throat:     Mouth: Mucous membranes are moist.     Pharynx: Oropharynx is clear.  Eyes:     Conjunctiva/sclera: Conjunctivae normal.  Cardiovascular:     Rate and Rhythm: Normal rate and regular rhythm.     Pulses: Normal pulses.          Radial pulses are 2+ on the right side and 2+ on the left side.       Posterior tibial pulses are 2+ on the right side and 2+ on the left side.     Comments: Tactile temperature in the extremities appropriate and equal bilaterally. Pulmonary:     Effort: Pulmonary effort is normal. No respiratory distress.  Abdominal:     Palpations: Abdomen is soft.     Tenderness: There is abdominal tenderness. There is no guarding.       Comments: Patient initially laying on her left side.  Musculoskeletal:     Cervical back: Neck supple.     Right lower leg: No edema.     Left lower leg: No edema.  Skin:    General: Skin is warm and dry.  Neurological:     Mental Status: She is alert.  Psychiatric:        Mood and Affect: Mood and affect normal.        Speech: Speech normal.        Behavior: Behavior normal.    Abnormal Labs Reviewed  CBC WITH DIFFERENTIAL/PLATELET - Abnormal; Notable for the following components:      Result Value   Hemoglobin 11.2 (*)    MCHC 29.6 (*)    RDW 16.1 (*)    Neutro Abs 8.3 (*)    All other components within normal limits  COMPREHENSIVE METABOLIC PANEL - Abnormal; Notable for the following components:   Chloride 97 (*)    Glucose, Bld 168 (*)    BUN 93 (*)    Creatinine, Ser 10.73 (*)    GFR, Estimated 4 (*)    All other  components within normal limits  URINALYSIS, ROUTINE W REFLEX MICROSCOPIC - Abnormal; Notable for the following components:   APPearance CLOUDY (*)    Glucose, UA 50 (*)    Hgb urine dipstick SMALL (*)    Protein, ur 100 (*)    Leukocytes,Ua LARGE (*)    WBC, UA >50 (*)    Bacteria, UA RARE (*)    All other components within normal limits    US Transvaginal Non-OB  Result Date: 03/02/2021 CLINICAL DATA:  Abdominal pain since yesterday.  Prior hysterectomy. EXAM: TRANSABDOMINAL AND TRANSVAGINAL ULTRASOUND OF PELVIS DOPPLER ULTRASOUND OF OVARIES TECHNIQUE: Both transabdominal and transvaginal ultrasound examinations of the pelvis were performed. Transabdominal technique was performed for global imaging of the pelvis including uterus, ovaries, adnexal regions, and pelvic cul-de-sac. It was necessary to proceed with endovaginal exam following the transabdominal exam to visualize the ovaries. Color and duplex Doppler ultrasound was utilized to evaluate blood flow to the ovaries. COMPARISON:  CT abdomen and pelvis 03/02/2021. Pelvic ultrasound 11/26/2011. FINDINGS: Examination is  limited by patient body habitus. Uterus Surgically absent. Right ovary Measurements: 2.9 x 1.4 x 1.8 cm = volume: 3.8 mL. Suboptimally visualized without evidence of a mass. Left ovary Measurements: 2.4 x 1.4 x 1.7 cm = volume: 3.0 mL. Suboptimally visualized without evidence of a mass Pulsed Doppler evaluation of both ovaries is limited but demonstrates gross arterial and venous flow bilaterally. Other findings No abnormal free fluid. IMPRESSION: 1. Limited assessment. No evidence of ovarian mass. Grossly symmetric blood flow in the ovaries. 2. Status post hysterectomy. Electronically Signed   By: Logan Bores M.D.   On: 03/02/2021 12:34   US Pelvis Complete  Result Date: 03/02/2021 CLINICAL DATA:  Abdominal pain since yesterday.  Prior hysterectomy. EXAM: TRANSABDOMINAL AND TRANSVAGINAL ULTRASOUND OF PELVIS DOPPLER ULTRASOUND  OF OVARIES TECHNIQUE: Both transabdominal and transvaginal ultrasound examinations of the pelvis were performed. Transabdominal technique was performed for global imaging of the pelvis including uterus, ovaries, adnexal regions, and pelvic cul-de-sac. It was necessary to proceed with endovaginal exam following the transabdominal exam to visualize the ovaries. Color and duplex Doppler ultrasound was utilized to evaluate blood flow to the ovaries. COMPARISON:  CT abdomen and pelvis 03/02/2021. Pelvic ultrasound 11/26/2011. FINDINGS: Examination is limited by patient body habitus. Uterus Surgically absent. Right ovary Measurements: 2.9 x 1.4 x 1.8 cm = volume: 3.8 mL. Suboptimally visualized without evidence of a mass. Left ovary Measurements: 2.4 x 1.4 x 1.7 cm = volume: 3.0 mL. Suboptimally visualized without evidence of a mass Pulsed Doppler evaluation of both ovaries is limited but demonstrates gross arterial and venous flow bilaterally. Other findings No abnormal free fluid. IMPRESSION: 1. Limited assessment. No evidence of ovarian mass. Grossly symmetric blood flow in the ovaries. 2. Status post hysterectomy. Electronically Signed   By: Logan Bores M.D.   On: 03/02/2021 12:34   [place] Art/Ven Flow Abd Pelv Doppler  Result Date: 03/02/2021 CLINICAL DATA:  Abdominal pain since yesterday.  Prior hysterectomy. EXAM: TRANSABDOMINAL AND TRANSVAGINAL ULTRASOUND OF PELVIS DOPPLER ULTRASOUND OF OVARIES TECHNIQUE: Both transabdominal and transvaginal ultrasound examinations of the pelvis were performed. Transabdominal technique was performed for global imaging of the pelvis including uterus, ovaries, adnexal regions, and pelvic cul-de-sac. It was necessary to proceed with endovaginal exam following the transabdominal exam to visualize the ovaries. Color and duplex Doppler ultrasound was utilized to evaluate blood flow to the ovaries. COMPARISON:  CT abdomen and pelvis 03/02/2021. Pelvic ultrasound 11/26/2011. FINDINGS:  Examination is limited by patient body habitus. Uterus Surgically absent. Right ovary Measurements: 2.9 x 1.4 x 1.8 cm = volume: 3.8 mL. Suboptimally visualized without evidence of a mass. Left ovary Measurements: 2.4 x 1.4 x 1.7 cm = volume: 3.0 mL. Suboptimally visualized without evidence of a mass Pulsed Doppler evaluation of both ovaries is limited but demonstrates gross arterial and venous flow bilaterally. Other findings No abnormal free fluid. IMPRESSION: 1. Limited assessment. No evidence of ovarian mass. Grossly symmetric blood flow in the ovaries. 2. Status post hysterectomy. Electronically Signed   By: Logan Bores M.D.   On: 03/02/2021 12:34   CT Renal Stone Study  Result Date: 03/02/2021 CLINICAL DATA:  Left lower quadrant and flank pain. EXAM: CT ABDOMEN AND PELVIS WITHOUT CONTRAST TECHNIQUE: Multidetector CT imaging of the abdomen and pelvis was performed following the standard protocol without IV contrast. COMPARISON:  08/08/2018 FINDINGS: Lower chest: 4 mm right middle lobe pulmonary nodule identified on image 2 of series 4. Hepatobiliary: The liver shows diffusely decreased attenuation suggesting fat deposition. No focal abnormality  in the liver on this study without intravenous contrast. Gallbladder is surgically absent. No intrahepatic or extrahepatic biliary dilation. Pancreas: No focal mass lesion. No dilatation of the main duct. No intraparenchymal cyst. No peripancreatic edema. Spleen: No splenomegaly. No focal mass lesion. Adrenals/Urinary Tract: No adrenal nodule or mass. No stones are seen in the left kidney or ureter. Mild fullness of the left intrarenal collecting system and proximal ureter evident without identifiable obstructive etiology. No stones are seen in the right kidney with a double-J right internal ureteral stent device again noted. Bladder is nondistended. Stomach/Bowel: Stomach is unremarkable. No gastric wall thickening. No evidence of outlet obstruction. Duodenum is  normally positioned as is the ligament of Treitz. No small bowel wall thickening. No small bowel dilatation. The terminal ileum is normal. The appendix is not well visualized, but there is no edema or inflammation in the region of the cecum. No gross colonic mass. No colonic wall thickening. Vascular/Lymphatic: There is abdominal aortic atherosclerosis without aneurysm. There is no gastrohepatic or hepatoduodenal ligament lymphadenopathy. No retroperitoneal or mesenteric lymphadenopathy. No pelvic sidewall lymphadenopathy. Reproductive: Uterus surgically absent.  There is no adnexal mass. Other: No intraperitoneal free fluid. Musculoskeletal: Small to moderate paraumbilical hernia contains only fat. No worrisome lytic or sclerotic osseous abnormality. IMPRESSION: 1. No acute findings in the abdomen or pelvis. Specifically, no findings to explain the patient's history of left lower quadrant pain. 2. Double-J right internal ureteral stent device in situ. 3. Mild fullness of the left intrarenal collecting system and proximal ureter without identifiable obstructive etiology. Correlation with renal function testing recommended in urology follow-up likely warranted. 4. Hepatic steatosis. 5. Small to moderate paraumbilical hernia contains only fat. 6. Aortic Atherosclerosis (ICD10-I70.0). Electronically Signed   By: Misty Stanley M.D.   On: 03/02/2021 08:19    CT with fullness of the left intrarenal collecting system and proximal ureter without noted stone.  Patient is still able to urinate here in the ED.  Possible pyelonephritis. Patient is nontoxic appearing, afebrile, not tachycardic, not tachypneic, not hypotensive, maintains excellent SPO2 on room air.  Initial SPO2 was not accurate.  I have reviewed the patient's chart to obtain more information.   I reviewed and interpreted the patient's labs and radiological studies.   Findings and plan of care discussed with attending physician, Charlesetta Shanks, MD. Dr.  Johnney Killian personally evaluated and examined this patient.  Vitals:   03/02/21 1345 03/02/21 1430 03/02/21 1537 03/02/21 1550  BP: (!) 180/105 (!) 170/88 (!) 164/75 (!) 164/75  Pulse: 67 67  67  Resp: '18 18  18  '$ Temp:   (!) 97.5 F (36.4 C) (!) 97.5 F (36.4 C)  TempSrc:    Oral  SpO2: 94% 98% 93% 98%       Layla Maw 03/02/21 1617    Charlesetta Shanks, MD 03/19/21 1011

## 2021-03-02 NOTE — ED Notes (Signed)
Unable to get IV for patient. Attempted 2x. Paged IV team.

## 2021-03-03 ENCOUNTER — Telehealth (HOSPITAL_COMMUNITY): Payer: Self-pay | Admitting: Emergency Medicine

## 2021-03-03 MED ORDER — CEPHALEXIN 500 MG PO CAPS: 1000.0000 mg | ORAL_CAPSULE | Freq: Two times a day (BID) | ORAL | 0 refills | Status: DC

## 2021-03-03 NOTE — Telephone Encounter (Signed)
Patient called to advise that cefixime is not available in pharmacies at this time.  Not available for over 100 miles radius.  Prescription changed to Keflex 1000 twice daily.

## 2021-03-05 ENCOUNTER — Other Ambulatory Visit: Payer: Self-pay

## 2021-03-05 ENCOUNTER — Encounter (HOSPITAL_COMMUNITY): Payer: Self-pay

## 2021-03-05 ENCOUNTER — Emergency Department (HOSPITAL_COMMUNITY): Payer: Medicare Other

## 2021-03-05 ENCOUNTER — Inpatient Hospital Stay (HOSPITAL_COMMUNITY)
Admission: EM | Admit: 2021-03-05 | Discharge: 2021-03-07 | DRG: 070 | Disposition: A | Discharge status: Home / Self Care | Payer: Medicare Other | Attending: Family Medicine | Admitting: Family Medicine

## 2021-03-05 DIAGNOSIS — D638 Anemia in other chronic diseases classified elsewhere: Secondary | ICD-10-CM | POA: Diagnosis present

## 2021-03-05 DIAGNOSIS — I10 Essential (primary) hypertension: Secondary | ICD-10-CM | POA: Diagnosis present

## 2021-03-05 DIAGNOSIS — Z6841 Body Mass Index (BMI) 40.0 and over, adult: Secondary | ICD-10-CM | POA: Diagnosis not present

## 2021-03-05 DIAGNOSIS — J9601 Acute respiratory failure with hypoxia: Secondary | ICD-10-CM | POA: Diagnosis present

## 2021-03-05 DIAGNOSIS — Z79899 Other long term (current) drug therapy: Secondary | ICD-10-CM

## 2021-03-05 DIAGNOSIS — T50905A Adverse effect of unspecified drugs, medicaments and biological substances, initial encounter: Secondary | ICD-10-CM

## 2021-03-05 DIAGNOSIS — K219 Gastro-esophageal reflux disease without esophagitis: Secondary | ICD-10-CM | POA: Diagnosis present

## 2021-03-05 DIAGNOSIS — Z7984 Long term (current) use of oral hypoglycemic drugs: Secondary | ICD-10-CM

## 2021-03-05 DIAGNOSIS — E1122 Type 2 diabetes mellitus with diabetic chronic kidney disease: Secondary | ICD-10-CM | POA: Diagnosis present

## 2021-03-05 DIAGNOSIS — E785 Hyperlipidemia, unspecified: Secondary | ICD-10-CM | POA: Diagnosis present

## 2021-03-05 DIAGNOSIS — Z888 Allergy status to other drugs, medicaments and biological substances status: Secondary | ICD-10-CM

## 2021-03-05 DIAGNOSIS — Z992 Dependence on renal dialysis: Secondary | ICD-10-CM | POA: Diagnosis not present

## 2021-03-05 DIAGNOSIS — J9602 Acute respiratory failure with hypercapnia: Secondary | ICD-10-CM | POA: Diagnosis present

## 2021-03-05 DIAGNOSIS — I120 Hypertensive chronic kidney disease with stage 5 chronic kidney disease or end stage renal disease: Secondary | ICD-10-CM | POA: Diagnosis present

## 2021-03-05 DIAGNOSIS — R4182 Altered mental status, unspecified: Secondary | ICD-10-CM

## 2021-03-05 DIAGNOSIS — E875 Hyperkalemia: Secondary | ICD-10-CM | POA: Diagnosis present

## 2021-03-05 DIAGNOSIS — N186 End stage renal disease: Secondary | ICD-10-CM | POA: Diagnosis present

## 2021-03-05 DIAGNOSIS — Z20822 Contact with and (suspected) exposure to covid-19: Secondary | ICD-10-CM | POA: Diagnosis present

## 2021-03-05 DIAGNOSIS — G9341 Metabolic encephalopathy: Principal | ICD-10-CM

## 2021-03-05 DIAGNOSIS — E662 Morbid (severe) obesity with alveolar hypoventilation: Secondary | ICD-10-CM | POA: Diagnosis present

## 2021-03-05 DIAGNOSIS — E11319 Type 2 diabetes mellitus with unspecified diabetic retinopathy without macular edema: Secondary | ICD-10-CM | POA: Diagnosis present

## 2021-03-05 DIAGNOSIS — Z881 Allergy status to other antibiotic agents status: Secondary | ICD-10-CM

## 2021-03-05 DIAGNOSIS — T402X5A Adverse effect of other opioids, initial encounter: Secondary | ICD-10-CM | POA: Diagnosis present

## 2021-03-05 DIAGNOSIS — G4733 Obstructive sleep apnea (adult) (pediatric): Secondary | ICD-10-CM | POA: Diagnosis present

## 2021-03-05 DIAGNOSIS — N19 Unspecified kidney failure: Secondary | ICD-10-CM

## 2021-03-05 DIAGNOSIS — D631 Anemia in chronic kidney disease: Secondary | ICD-10-CM | POA: Diagnosis present

## 2021-03-05 DIAGNOSIS — N390 Urinary tract infection, site not specified: Secondary | ICD-10-CM | POA: Diagnosis present

## 2021-03-05 DIAGNOSIS — N189 Chronic kidney disease, unspecified: Secondary | ICD-10-CM | POA: Diagnosis present

## 2021-03-05 LAB — CBC WITH DIFFERENTIAL/PLATELET
Abs Immature Granulocytes: 0.25 10*3/uL — ABNORMAL HIGH (ref 0.00–0.07)
Basophils Absolute: 0.1 10*3/uL (ref 0.0–0.1)
Basophils Relative: 1 %
Eosinophils Absolute: 0.2 10*3/uL (ref 0.0–0.5)
Eosinophils Relative: 2 %
HCT: 41.1 % (ref 36.0–46.0)
Hemoglobin: 12.1 g/dL (ref 12.0–15.0)
Immature Granulocytes: 2 %
Lymphocytes Relative: 12 %
Lymphs Abs: 1.3 10*3/uL (ref 0.7–4.0)
MCH: 27.7 pg (ref 26.0–34.0)
MCHC: 29.4 g/dL — ABNORMAL LOW (ref 30.0–36.0)
MCV: 94.1 fL (ref 80.0–100.0)
Monocytes Absolute: 0.6 10*3/uL (ref 0.1–1.0)
Monocytes Relative: 5 %
Neutro Abs: 9.1 10*3/uL — ABNORMAL HIGH (ref 1.7–7.7)
Neutrophils Relative %: 78 %
Platelets: 267 10*3/uL (ref 150–400)
RBC: 4.37 MIL/uL (ref 3.87–5.11)
RDW: 16.2 % — ABNORMAL HIGH (ref 11.5–15.5)
WBC: 11.6 10*3/uL — ABNORMAL HIGH (ref 4.0–10.5)
nRBC: 1.3 % — ABNORMAL HIGH (ref 0.0–0.2)

## 2021-03-05 LAB — COMPREHENSIVE METABOLIC PANEL
ALT: 231 U/L — ABNORMAL HIGH (ref 0–44)
AST: 112 U/L — ABNORMAL HIGH (ref 15–41)
Albumin: 3.8 g/dL (ref 3.5–5.0)
Alkaline Phosphatase: 115 U/L (ref 38–126)
Anion gap: 22 — ABNORMAL HIGH (ref 5–15)
BUN: 132 mg/dL — ABNORMAL HIGH (ref 6–20)
CO2: 23 mmol/L (ref 22–32)
Calcium: 9 mg/dL (ref 8.9–10.3)
Chloride: 95 mmol/L — ABNORMAL LOW (ref 98–111)
Creatinine, Ser: 15.9 mg/dL — ABNORMAL HIGH (ref 0.44–1.00)
GFR, Estimated: 3 mL/min — ABNORMAL LOW (ref >60)
Glucose, Bld: 95 mg/dL (ref 70–99)
Potassium: 6 mmol/L — ABNORMAL HIGH (ref 3.5–5.1)
Sodium: 140 mmol/L (ref 135–145)
Total Bilirubin: 0.8 mg/dL (ref 0.3–1.2)
Total Protein: 8.2 g/dL — ABNORMAL HIGH (ref 6.5–8.1)

## 2021-03-05 LAB — BLOOD GAS, ARTERIAL
Acid-base deficit: 3.9 mmol/L — ABNORMAL HIGH (ref 0.0–2.0)
Bicarbonate: 24.7 mmol/L (ref 20.0–28.0)
O2 Saturation: 96.2 %
Patient temperature: 98.3
pCO2 arterial: 64.4 mmHg — ABNORMAL HIGH (ref 32.0–48.0)
pH, Arterial: 7.207 — ABNORMAL LOW (ref 7.350–7.450)
pO2, Arterial: 106 mmHg (ref 83.0–108.0)

## 2021-03-05 LAB — ETHANOL: Alcohol, Ethyl (B): <10 mg/dL (ref <10)

## 2021-03-05 LAB — CBG MONITORING, ED: Glucose-Capillary: 85 mg/dL (ref 70–99)

## 2021-03-05 LAB — LACTIC ACID, PLASMA: Lactic Acid, Venous: 1.5 mmol/L (ref 0.5–1.9)

## 2021-03-05 MED ORDER — SODIUM ZIRCONIUM CYCLOSILICATE 10 G PO PACK
10.0000 g | PACK | Freq: Once | ORAL | Status: AC
  Administered 2021-03-06: 10 g via ORAL
  Filled 2021-03-05: qty 1

## 2021-03-05 MED ORDER — INSULIN ASPART 100 UNIT/ML IV SOLN
5.0000 [IU] | Freq: Once | INTRAVENOUS | Status: AC
  Administered 2021-03-06: 5 [IU] via INTRAVENOUS
  Filled 2021-03-05: qty 0.05

## 2021-03-05 MED ORDER — CALCIUM GLUCONATE-NACL 1-0.675 GM/50ML-% IV SOLN
1.0000 g | Freq: Once | INTRAVENOUS | Status: AC
  Administered 2021-03-06: 1000 mg via INTRAVENOUS
  Filled 2021-03-05: qty 50

## 2021-03-05 MED ORDER — NALOXONE HCL 0.4 MG/ML IJ SOLN
0.4000 mg | Freq: Once | INTRAMUSCULAR | Status: AC
  Administered 2021-03-05: 0.4 mg via INTRAVENOUS
  Filled 2021-03-05: qty 1

## 2021-03-05 MED ORDER — DEXTROSE 50 % IV SOLN
1.0000 | Freq: Once | INTRAVENOUS | Status: AC
  Administered 2021-03-06: 50 mL via INTRAVENOUS
  Filled 2021-03-05: qty 50

## 2021-03-05 NOTE — H&P (Signed)
History and Physical   Autumn Mccormick DOB: [birth year] DOA: 03/05/2021  Referring MD/NP/PA: Dr. Billy Fischer  PCP: Lin Landsman, MD   Outpatient Specialists: [facility]  Patient coming from: Home  Chief Complaint: Altered mental status  HPI: Autumn Mccormick is a 45 y.o. female with medical history significant of end-stage renal disease on hemodialysis Tuesdays Thursdays and Saturdays, hypertension, hyperlipidemia, GERD, who apparently missed her hemodialysis on Saturday secondary to altered mental status.  Patient also to some morphine inadvertently.  She is not on that by prescription.  She was brought in confused and hypoxic.  Sats were apparently in the 80s when EMS arrived.  Her son called and patient was unable to answer him.  She was hallucinating.  Symptoms started on Friday.  He initially thought it was a UTI that she was recently diagnosed with.  She is taking Keflex for that and she will have completed treatment by now.  She is currently somnolent and not able to give history.  Sats were in the 70s at 77% when she first arrived the ER.  Currently titrated down to 2 L of oxygen and sats in the 90s.  Patient also received Narcan and appears to be doing better.  She has been admitted to [facility] for dialysis.  Potassium was 6.0.  Nephrology already consulted..  ED Course: Temperature 98.8 blood pressure 178/76, pulse 102, respiratory rate of 18, oxygen sat 77% on room air and 100% on 2 L.  White count is 11.6 hemoglobin 12.1 and platelets 367.  Sodium 140, potassium 6.0 chloride 95 CO2 23 BUN 132 creatinine 15.9 and calcium 9.0.  Glucose 95.  ABG showed a pH of 7.207.  Lactic acid 1.5.  Alcohol level is less than 10.  Chest x-ray shows no active disease.  Patient being admitted to the hospital for further evaluation and treatment.  Review of Systems: As per HPI otherwise 10 point review of systems negative.    Past Medical History:  Diagnosis Date  . Diabetes  mellitus without complication ([place])   . Hypertension   . Renal disorder     Past Surgical History:  Procedure Laterality Date  . kidney stent Right      reports that she has never smoked. She has never used smokeless tobacco. She reports current alcohol use. She reports current drug use. Drug: Marijuana.  Allergies  Allergen Reactions  . Augmentin [Amoxicillin-Pot Clavulanate] Diarrhea  . Metformin And Related Diarrhea    History reviewed. No pertinent family history.   Prior to Admission medications   Medication Sig Start Date End Date Taking? Authorizing Provider  amitriptyline (ELAVIL) 25 MG tablet Take 25 mg by mouth at bedtime. 01/07/20   [provider]  atorvastatin (LIPITOR) 20 MG tablet Take 20 mg by mouth every evening.  01/07/20   [provider]  calcium acetate (PHOSLO) 667 MG capsule Take 1,334 mg by mouth with breakfast, with lunch, and with evening meal. 02/26/21   [provider]  carvedilol (COREG) 25 MG tablet Take 25 mg by mouth 2 (two) times daily. 03/08/20   [provider]  cefixime (SUPRAX) 100 MG/5ML suspension Take 10 mLs (200 mg total) by mouth daily for 10 doses. 03/02/21 03/12/21  Sherrill Raring, PA-C  cephALEXin (KEFLEX) 500 MG capsule Take 2 capsules (1,000 mg total) by mouth 2 (two) times daily. 03/03/21   Charlesetta Shanks, MD  diclofenac sodium (VOLTAREN) 1 % GEL Apply 4 g topically 4 (four) times daily. Patient not taking: No  sig reported 08/14/18   Joy, Shawn C, PA-C  HYDROcodone-acetaminophen (NORCO/VICODIN) 5-325 MG tablet Take 1 tablet by mouth every 6 (six) hours as needed for severe pain. 03/02/21   Joy, Shawn C, PA-C  insulin glargine (LANTUS SOLOSTAR) 100 UNIT/ML Solostar Pen Inject 40 Units into the skin every evening. 02/21/21   [provider]  lidocaine (LIDODERM) 5 % Place 1 patch onto the skin daily. Remove & Discard patch within 12 hours or as directed by MD Patient not taking: No sig reported 08/14/18    Joy, Shawn C, PA-C  losartan (COZAAR) 100 MG tablet Take 100 mg by mouth daily. 01/07/20   [provider]  methocarbamol (ROBAXIN) 500 MG tablet Take 1 tablet (500 mg total) by mouth 2 (two) times daily. Patient not taking: No sig reported 08/14/18   Joy, Shawn C, PA-C  Multiple Vitamins-Minerals (MULTIVITAMIN ADULTS PO) Take 1 tablet by mouth every evening.    [provider]  omeprazole (PRILOSEC) 20 MG capsule Take 20 mg by mouth daily as needed (indigestion).  03/07/20   [provider]  ondansetron (ZOFRAN ODT) 4 MG disintegrating tablet Take 1 tablet (4 mg total) by mouth every 8 (eight) hours as needed for nausea or vomiting. 03/02/21   Joy, Shawn C, PA-C  sevelamer carbonate (RENVELA) 800 MG tablet Take 1,600 mg by mouth 3 (three) times daily with meals.  02/23/20   [provider]    Physical Exam: Vitals:   03/05/21 2130 03/05/21 2145 03/05/21 2200 03/05/21 2232  BP: 114/80  (!) 142/39 (!) 178/76  Pulse: (!) 102 67 69 69  Resp: '18  16 16  '$ Temp:      TempSrc:      SpO2: 98% 100% 96% 100%      Constitutional: Acutely ill looking, somnolent, no distress Vitals:   03/05/21 2130 03/05/21 2145 03/05/21 2200 03/05/21 2232  BP: 114/80  (!) 142/39 (!) 178/76  Pulse: (!) 102 67 69 69  Resp: '18  16 16  '$ Temp:      TempSrc:      SpO2: 98% 100% 96% 100%   Eyes: PERRL, lids and conjunctivae normal ENMT: Mucous membranes are moist. Posterior pharynx clear of any exudate or lesions.Normal dentition.  Neck: normal, supple, no masses, no thyromegaly Respiratory: clear to auscultation bilaterally, no wheezing, mild bilateral basal crackles normal respiratory effort. No accessory muscle use.  Cardiovascular: Sinus tachycardia, no murmurs / rubs / gallops. No extremity edema. 2+ pedal pulses. No carotid bruits.  Abdomen: no tenderness, no masses palpated. No hepatosplenomegaly. Bowel sounds positive.  Musculoskeletal: no clubbing / cyanosis. No joint  deformity upper and lower extremities. Good ROM, no contractures. Normal muscle tone.  Skin: no rashes, lesions, ulcers. No induration Neurologic: CN 2-12 grossly intact. Sensation intact, DTR normal. Strength 5/5 in all 4.  No focal deficits Psychiatric: Confused, somnolent,    Labs on Admission: I have personally reviewed following labs and imaging studies  CBC: Recent Labs  Lab 03/02/21 0727 03/05/21 2137  WBC 9.7 11.6*  NEUTROABS 8.3* 9.1*  HGB 11.2* 12.1  HCT 37.9 41.1  MCV 92.4 94.1  PLT 244 99991111   Basic Metabolic Panel: Recent Labs  Lab 03/02/21 0727 03/05/21 2137  NA 138 140  K 5.1 6.0*  CL 97* 95*  CO2 27 23  GLUCOSE 168* 95  BUN 93* 132*  CREATININE 10.73* 15.90*  CALCIUM 9.1 9.0   GFR: CrCl cannot be calculated (Unknown ideal weight.). Liver Function Tests: Recent Labs  Lab 03/02/21 0727 03/05/21 2137  AST 16 112*  ALT 15 231*  ALKPHOS 80 115  BILITOT 0.3 0.8  PROT 8.1 8.2*  ALBUMIN 3.8 3.8   Recent Labs  Lab 03/02/21 0727  LIPASE 39   No results for input(s): AMMONIA in the last 168 hours. Coagulation Profile: No results for input(s): INR, PROTIME in the last 168 hours. Cardiac Enzymes: No results for input(s): CKTOTAL, CKMB, CKMBINDEX, TROPONINI in the last 168 hours. BNP (last 3 results) No results for input(s): PROBNP in the last 8760 hours. HbA1C: No results for input(s): HGBA1C in the last 72 hours. CBG: Recent Labs  Lab 03/05/21 2048  GLUCAP 85   Lipid Profile: No results for input(s): CHOL, HDL, LDLCALC, TRIG, CHOLHDL, LDLDIRECT in the last 72 hours. Thyroid Function Tests: No results for input(s): TSH, T4TOTAL, FREET4, T3FREE, THYROIDAB in the last 72 hours. Anemia Panel: No results for input(s): VITAMINB12, FOLATE, FERRITIN, TIBC, IRON, RETICCTPCT in the last 72 hours. Urine analysis:    Component Value Date/Time   COLORURINE YELLOW 03/02/2021 1124   APPEARANCEUR CLOUDY (A) 03/02/2021 1124   LABSPEC 1.010 03/02/2021  1124   PHURINE 7.0 03/02/2021 1124   GLUCOSEU 50 (A) 03/02/2021 1124   HGBUR SMALL (A) 03/02/2021 1124   BILIRUBINUR NEGATIVE 03/02/2021 1124   KETONESUR NEGATIVE 03/02/2021 1124   PROTEINUR 100 (A) 03/02/2021 1124   NITRITE NEGATIVE 03/02/2021 1124   LEUKOCYTESUR LARGE (A) 03/02/2021 1124   Sepsis Labs: '@LABRCNTIP'$ (procalcitonin:4,lacticidven:4) )No results found for this or any previous visit (from the past 240 hour(s)).   Radiological Exams on Admission: CT Head Wo Contrast  Result Date: 03/05/2021 CLINICAL DATA:  Delirium EXAM: CT HEAD WITHOUT CONTRAST TECHNIQUE: Contiguous axial images were obtained from the base of the skull through the vertex without intravenous contrast. COMPARISON:  None. FINDINGS: Brain: There is no acute intracranial hemorrhage, mass effect, or edema. Gray-white differentiation is preserved. There is no extra-axial fluid collection. Ventricles and sulci are within normal limits in size and configuration. Vascular: There is atherosclerotic calcification at the skull base. Skull: Calvarium is unremarkable. Sinuses/Orbits: Mild polypoid maxillary sinus mucosal thickening. No acute abnormality of the orbits. Other: None. IMPRESSION: No acute intracranial abnormality. Electronically Signed   By: Macy Mis M.D.   On: 03/05/2021 21:00   DG Chest Portable 1 View  Result Date: 03/05/2021 CLINICAL DATA:  Dyspnea EXAM: PORTABLE CHEST 1 VIEW COMPARISON:  11/14/2020 FINDINGS: Lungs are clear save for minimal left basilar scarring. No pneumothorax or pleural effusion. Mild cardiomegaly is stable when accounting for changes in patient positioning. Pulmonary vascularity is normal. No acute bone abnormality. IMPRESSION: No active disease.  Stable cardiomegaly. Electronically Signed   By: Fidela Salisbury MD   On: 03/05/2021 21:16    EKG: Independently reviewed.  Shows normal sinus rhythm with low voltage EKG.  No significant ST changes.  No peaked T  waves.  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  Most likely secondary to missing hemodialysis.  Also narcotics related with the morphine intake.  This is being reversed  with Narcan.  Patient will be admitted for mediated hemodialysis possibly in the morning.  #2 hyperkalemia: Secondary to missing hemodialysis.  Follow nephrology consultation and recommendations.  #3 obstructive sleep apnea: Continue with home regimen of CPAP at night.  #4 essential hypertension: Patient on carvedilol, losartan among other things.  Resume that in the hospital.  #5 acute respiratory failure with hypoxemia: Secondary to missing hemodialysis.  Continue monitoring.  #6 insulin-dependent diabetes type 2: Sliding scale insulin with the long-acting Lantus.  #7 end-stage renal disease: For hemodialysis tomorrow.  #8 morbid obesity: Dietary counseling.  #9 anemia of chronic disease: Continue to monitor H&H.  #10 GERD: We will resume PPIs.   DVT prophylaxis: Heparin Code Status: Full code Family Communication: Son Disposition Plan: Home Consults called: Dr. Baird Cancer, nephrology Admission status: Inpatient  Severity of Illness: The appropriate patient status for this patient is INPATIENT. Inpatient status is judged to be reasonable and necessary in order to provide the required intensity of service to ensure the patient's safety. The patient's presenting symptoms, physical exam findings, and initial radiographic and laboratory data  in the context of their chronic comorbidities is felt to place them at high risk for further clinical deterioration. Furthermore, it is not anticipated that the patient will be medically stable for discharge from the hospital within 2 midnights of admission. The following factors support the patient status of inpatient.   " The patient's presenting symptoms include altered mental status. " The worrisome physical exam findings include confused. " The initial radiographic and laboratory data are worrisome because of potassium of 6.0. " The chronic co-morbidities include end-stage renal disease.   * I certify that at the point of admission it is my clinical judgment that the patient will require inpatient hospital care spanning beyond 2 midnights from the point of admission due to high intensity of service, high risk for further deterioration and high frequency of surveillance required.Barbette Merino MD [facility] Pager [phone]  If 7PM-7AM, please contact night-coverage [email or web address] Password [facility]  03/05/2021, 11:32 PM

## 2021-03-05 NOTE — Consult Note (Signed)
Aki Thomlinson Admit Date: 03/05/2021 03/05/2021 Rexene Agent Requesting Physician:  Billy Fischer MD  Reason for Consult:  AMS, Hyperkalemia, SOB, ESRD HPI:  23F ESRD [facility]S [facility] via LUE AVF presented to the ED with confusion and dyspnea.  Patient was recently seen on 5/20 because of flank pain and was treated for UTI.  Apparently at that time she also received some morphine.  Since that time of discharge from the previous ED visit she has had persistent confusion and then developed some dyspnea.  She missed her HD treatment on 5/21.  She was hypoxic when evaluated by the EMS.  In the ED at the current evaluation she was also found to have hypercapnic respiratory failure with improvement after administration of Narcan.  Evaluation in the ED revealed potassium of 6.0, bicarbonate of 23, BUN of 132 with a creatinine of 15.9.  ABG was seven-point 2/64/106.  EKG without peaked T waves or prolongation of PR or QRS intervals.  At the time of my evaluation she is awake and confused, only able to oriented to herself.  She is not able to provide significant further history.  Outpatient dialysis prescription is unknown.  It does appear that she is pursuing transition to peritoneal dialysis.  PMH Incudes:  DM2  Hypertension  ROS Unable to complete a 12 system review because of patient's encephalopathy  PMH  Past Medical History:  Diagnosis Date  . Diabetes mellitus without complication ([place])   . Hypertension   . Renal disorder    PSH  Past Surgical History:  Procedure Laterality Date  . kidney stent Right    FH History reviewed. No pertinent family history. SH  reports that she has never smoked. She has never used smokeless tobacco. She reports current alcohol use. She reports current drug use. Drug: Marijuana. Allergies  Allergies  Allergen Reactions  . Augmentin [Amoxicillin-Pot Clavulanate] Diarrhea  . Metformin And Related Diarrhea   Home medications Prior to  Admission medications   Medication Sig Start Date End Date Taking? Authorizing Provider  amitriptyline (ELAVIL) 25 MG tablet Take 25 mg by mouth at bedtime. 01/07/20   [provider]  atorvastatin (LIPITOR) 20 MG tablet Take 20 mg by mouth every evening.  01/07/20   [provider]  calcium acetate (PHOSLO) 667 MG capsule Take 1,334 mg by mouth with breakfast, with lunch, and with evening meal. 02/26/21   [provider]  carvedilol (COREG) 25 MG tablet Take 25 mg by mouth 2 (two) times daily. 03/08/20   [provider]  cefixime (SUPRAX) 100 MG/5ML suspension Take 10 mLs (200 mg total) by mouth daily for 10 doses. 03/02/21 03/12/21  Sherrill Raring, PA-C  cephALEXin (KEFLEX) 500 MG capsule Take 2 capsules (1,000 mg total) by mouth 2 (two) times daily. 03/03/21   Charlesetta Shanks, MD  diclofenac sodium (VOLTAREN) 1 % GEL Apply 4 g topically 4 (four) times daily. Patient not taking: No sig reported 08/14/18   Joy, Shawn C, PA-C  HYDROcodone-acetaminophen (NORCO/VICODIN) 5-325 MG tablet Take 1 tablet by mouth every 6 (six) hours as needed for severe pain. 03/02/21   Joy, Shawn C, PA-C  insulin glargine (LANTUS SOLOSTAR) 100 UNIT/ML Solostar Pen Inject 40 Units into the skin every evening. 02/21/21   [provider]  lidocaine (LIDODERM) 5 % Place 1 patch onto the skin daily. Remove & Discard patch within 12 hours or as directed by MD Patient not taking: No sig reported 08/14/18   Lorayne Bender, PA-C  losartan (COZAAR) 100 MG tablet Take 100 mg by mouth daily. 01/07/20   [provider]  methocarbamol (ROBAXIN) 500 MG tablet Take 1 tablet (500 mg total) by mouth 2 (two) times daily. Patient not taking: No sig reported 08/14/18   Joy, Shawn C, PA-C  Multiple Vitamins-Minerals (MULTIVITAMIN ADULTS PO) Take 1 tablet by mouth every evening.    [provider]  omeprazole (PRILOSEC) 20 MG capsule Take 20 mg by mouth daily as needed (indigestion).  03/07/20    [provider]  ondansetron (ZOFRAN ODT) 4 MG disintegrating tablet Take 1 tablet (4 mg total) by mouth every 8 (eight) hours as needed for nausea or vomiting. 03/02/21   Joy, Shawn C, PA-C  sevelamer carbonate (RENVELA) 800 MG tablet Take 1,600 mg by mouth 3 (three) times daily with meals.  02/23/20   [provider]    Current Medications Scheduled Meds: . dextrose  1 ampule Intravenous Once  . insulin aspart  5 Units Intravenous Once  . sodium zirconium cyclosilicate  10 g Oral Once   Continuous Infusions: . calcium gluconate     PRN Meds:.  CBC Recent Labs  Lab 03/02/21 0727 03/05/21 2137  WBC 9.7 11.6*  NEUTROABS 8.3* 9.1*  HGB 11.2* 12.1  HCT 37.9 41.1  MCV 92.4 94.1  PLT 244 99991111   Basic Metabolic Panel Recent Labs  Lab 03/02/21 0727 03/05/21 2137  NA 138 140  K 5.1 6.0*  CL 97* 95*  CO2 27 23  GLUCOSE 168* 95  BUN 93* 132*  CREATININE 10.73* 15.90*  CALCIUM 9.1 9.0    Physical Exam  Blood pressure (!) 178/76, pulse 69, temperature 98.8 F (37.1 C), temperature source Oral, resp. rate 16, SpO2 100 %. GEN: Obese, confused, oriented to self only ENT: NCAT EYES: EOMI CV: Regular, normal rate, normal S1 and S2 PULM: Diminished throughout, normal work of breathing at the current time ABD: Protuberant, soft, nontender SKIN: No rashes or lesions EXT: No significant lower extremity edema Left upper arm AV fistula with noncontinuous bruit   Assessment 52F ESRD THS [place] (non [place]) with AMS, hypoxic/hypercapneic RF, mild hyperkalemia  1. ESRD THS LUE AVF, missed 5/21, sig azotemia 2. Hyperkalemia, rec med mgmt Ca/insulin/lokelma in ED 3. Hypercapenic / hypoxic RF, likely some component of narcotic related, improved with narcan 4. Recent UTI on keflex as outpt 5. CKD-BMD 6. Anemia 7. HTN/Vol  Plan 1. Pt to transfer to [facility] from [facility] ED for HD 2. HD first in AM: 3h, 300/500, 2K, no heparin, 3L UF 3. AVF possibly sig stenosed with her  sig azotemia and based on exam, if difficulty with HD consider Fgram during admission 4. Avoid morphine in ESRD population 5. Cont binders 6. Check Phos 7. No ESA indicated   Rexene Agent  03/05/2021, 11:46 PM

## 2021-03-05 NOTE — ED Provider Notes (Signed)
[facility] DEPT Provider Note   CSN: [id number] Arrival date & time: 03/05/21  1940     History Chief Complaint  Patient presents with  . Altered Mental Status    Autumn Mccormick is a 45 y.o. female.  HPI     45yo female with a history of diabetes, hypertension, ESRD on dialysis Tuesday Thursday Saturday who was seen in the emergency department on May 20 with concern for left flank pain, she was diagnosed with a urinary tract infection, and presents today with concern for altered mental status.  Son reports that since she left the emergency department she has been sleepy, confused, not acting herself and that she had received morphine while in the ED. she is prescribed opiates, but son reports that she had not taken any.  He denies her having history of taking chronic opiates. She reports that her pain has improved.  She missed dialysis on Saturday but she is not feeling up to going.  They were unable to start her antibiotics until Saturday has had to go to several pharmacies in order to pick it up.  She initially had nausea and vomiting but this has improved.  She did not wear her CPAP on Saturday night.     No fever, no diarrhea, black or bloody stool, no focal weakness/numbness/cough or shortness of breath .   Past Medical History:  Diagnosis Date  . Diabetes mellitus without complication ([place])   . Hypertension   . Renal disorder     Patient Active Problem List   Diagnosis Date Noted  . Acute metabolic encephalopathy [id number]  . Acute respiratory failure with hypoxia and hypercapnia ([place]) 09/03/2019  . Hyperkalemia 08/19/2019  . Obstructive sleep apnea (adult) (pediatric) 10/14/2018  . Morbid (severe) obesity with alveolar hypoventilation ([place]) 10/14/2018  . Type 2 diabetes w unsp diabetic rtnop w/o macular edema ([place]) 10/14/2018  . Anemia due to chronic kidney disease 07/21/2017  . ESRD (end stage renal disease) on dialysis ([place]) 11/27/2016   . Essential (primary) hypertension 07/15/2016    Past Surgical History:  Procedure Laterality Date  . kidney stent Right      OB History    Gravida      Para      Term      Preterm      AB      Living  1     SAB      IAB      Ectopic      Multiple      Live Births              History reviewed. No pertinent family history.  Social History   Tobacco Use  . Smoking status: Never Smoker  . Smokeless tobacco: Never Used  Vaping Use  . Vaping Use: Never used  Substance Use Topics  . Alcohol use: Yes    Comment: Socially  . Drug use: Yes    Types: Marijuana    Home Medications Prior to Admission medications   Medication Sig Start Date End Date Taking? Authorizing Provider  amitriptyline (ELAVIL) 25 MG tablet Take 25 mg by mouth at bedtime. 01/07/20   [provider]  atorvastatin (LIPITOR) 20 MG tablet Take 20 mg by mouth every evening.  01/07/20   [provider]  calcium acetate (PHOSLO) 667 MG capsule Take 1,334 mg by mouth with breakfast, with lunch, and with evening meal. 02/26/21   [provider]  carvedilol (COREG) 25 MG tablet  Take 25 mg by mouth 2 (two) times daily. 03/08/20   [provider]  cefixime (SUPRAX) 100 MG/5ML suspension Take 10 mLs (200 mg total) by mouth daily for 10 doses. 03/02/21 03/12/21  Sherrill Raring, PA-C  cephALEXin (KEFLEX) 500 MG capsule Take 2 capsules (1,000 mg total) by mouth 2 (two) times daily. 03/03/21   Charlesetta Shanks, MD  diclofenac sodium (VOLTAREN) 1 % GEL Apply 4 g topically 4 (four) times daily. Patient not taking: No sig reported 08/14/18   Joy, Shawn C, PA-C  glipiZIDE (GLUCOTROL XL) 5 MG 24 hr tablet Take 5 mg by mouth daily with breakfast.    [provider]  HYDROcodone-acetaminophen (NORCO/VICODIN) 5-325 MG tablet Take 1 tablet by mouth every 6 (six) hours as needed for severe pain. 03/02/21   Joy, Shawn C, PA-C  insulin glargine (LANTUS SOLOSTAR) 100 UNIT/ML Solostar  Pen Inject 40 Units into the skin every evening. 02/21/21   [provider]  lidocaine (LIDODERM) 5 % Place 1 patch onto the skin daily. Remove & Discard patch within 12 hours or as directed by MD Patient not taking: No sig reported 08/14/18   Joy, Shawn C, PA-C  losartan (COZAAR) 100 MG tablet Take 100 mg by mouth daily. 01/07/20   [provider]  methocarbamol (ROBAXIN) 500 MG tablet Take 1 tablet (500 mg total) by mouth 2 (two) times daily. Patient not taking: No sig reported 08/14/18   Joy, Shawn C, PA-C  Multiple Vitamins-Minerals (MULTIVITAMIN ADULTS PO) Take 1 tablet by mouth every evening.    [provider]  omeprazole (PRILOSEC) 20 MG capsule Take 20 mg by mouth daily as needed (indigestion).  03/07/20   [provider]  ondansetron (ZOFRAN ODT) 4 MG disintegrating tablet Take 1 tablet (4 mg total) by mouth every 8 (eight) hours as needed for nausea or vomiting. 03/02/21   Joy, Shawn C, PA-C  sevelamer carbonate (RENVELA) 800 MG tablet Take 1,600 mg by mouth 3 (three) times daily with meals.  02/23/20   [provider]    Allergies    Augmentin [amoxicillin-pot clavulanate] and Metformin and related  Review of Systems   Review of Systems  Constitutional: Positive for fatigue. Negative for fever.  HENT: Negative for sore throat.   Eyes: Negative for visual disturbance.  Respiratory: Negative for cough and shortness of breath.   Cardiovascular: Negative for chest pain.  Gastrointestinal: Negative for abdominal pain, nausea and vomiting.  Genitourinary: Negative for difficulty urinating.  Musculoskeletal: Negative for back pain and neck pain.  Skin: Negative for rash.  Neurological: Negative for syncope and headaches.  Psychiatric/Behavioral: Positive for confusion.    Physical Exam Updated Vital Signs BP (!) 159/75   Pulse 82   Temp 98.6 F (37 C) (Oral)   Resp 20   SpO2 100%   Physical Exam Vitals and nursing note reviewed.   Constitutional:      General: She is sleeping. She is not in acute distress.    Appearance: She is well-developed. She is obese. She is ill-appearing and toxic-appearing. She is not diaphoretic.  HENT:     Head: Normocephalic and atraumatic.  Eyes:     Conjunctiva/sclera: Conjunctivae normal.  Cardiovascular:     Rate and Rhythm: Normal rate and regular rhythm.     Heart sounds: Normal heart sounds. No murmur heard. No friction rub. No gallop.   Pulmonary:     Effort: Pulmonary effort is normal. No respiratory distress.     Breath sounds: Normal  breath sounds. No wheezing or rales.  Abdominal:     General: There is no distension.     Palpations: Abdomen is soft.     Tenderness: There is no abdominal tenderness. There is no guarding.  Musculoskeletal:        General: No tenderness.     Cervical back: Normal range of motion.  Skin:    General: Skin is warm and dry.     Findings: No erythema or rash.  Neurological:     Mental Status: She is oriented to person, place, and time.     GCS: GCS eye subscore is 4. GCS verbal subscore is 5. GCS motor subscore is 6.     Comments: Sleepy but answering questions and following commands No focal weakness Holds arms up then occasional myoclonic jerk and looking to fall asleep      ED Results / Procedures / Treatments   Labs (all labs ordered are listed, but only abnormal results are displayed) Labs Reviewed  COMPREHENSIVE METABOLIC PANEL - Abnormal; Notable for the following components:      Result Value   Potassium 6.0 (*)    Chloride 95 (*)    BUN 132 (*)    Creatinine, Ser 15.90 (*)    Total Protein 8.2 (*)    AST 112 (*)    ALT 231 (*)    GFR, Estimated 3 (*)    Anion gap 22 (*)    All other components within normal limits  CBC WITH DIFFERENTIAL/PLATELET - Abnormal; Notable for the following components:   WBC 11.6 (*)    MCHC 29.4 (*)    RDW 16.2 (*)    nRBC 1.3 (*)    Neutro Abs 9.1 (*)    Abs Immature Granulocytes 0.25  (*)    All other components within normal limits  BLOOD GAS, ARTERIAL - Abnormal; Notable for the following components:   pH, Arterial 7.207 (*)    pCO2 arterial 64.4 (*)    Acid-base deficit 3.9 (*)    All other components within normal limits  URINALYSIS, ROUTINE W REFLEX MICROSCOPIC - Abnormal; Notable for the following components:   APPearance CLOUDY (*)    Hgb urine dipstick LARGE (*)    Protein, ur 100 (*)    Leukocytes,Ua LARGE (*)    RBC / HPF >50 (*)    WBC, UA >50 (*)    Bacteria, UA RARE (*)    Squamous Epithelial / LPF >50 (*)    All other components within normal limits  RESP PANEL BY RT-PCR (FLU A&B, COVID) ARPGX2  SARS CORONAVIRUS 2 (TAT 6-24 HRS)  URINE CULTURE  ETHANOL  LACTIC ACID, PLASMA  LACTIC ACID, PLASMA  CBG MONITORING, ED    EKG EKG Interpretation  Date/Time:  Monday Mar 05 2021 20:54:26 EDT Ventricular Rate:  70 PR Interval:  196 QRS Duration: 83 QT Interval:  399 QTC Calculation: 431 R Axis:   114 Text Interpretation: Sinus rhythm Right axis deviation Low voltage, precordial leads No significant change since last tracing Confirmed by Gareth Morgan [phone]) on 03/05/2021 10:59:03 PM   Radiology CT Head Wo Contrast  Result Date: 03/05/2021 CLINICAL DATA:  [place] EXAM: CT HEAD WITHOUT CONTRAST TECHNIQUE: Contiguous axial images were obtained from the base of the skull through the vertex without intravenous contrast. COMPARISON:  None. FINDINGS: Brain: There is no acute intracranial hemorrhage, mass effect, or edema. Gray-white differentiation is preserved. There is no extra-axial fluid collection. Ventricles and sulci are within normal limits  in size and configuration. Vascular: There is atherosclerotic calcification at the skull base. Skull: Calvarium is unremarkable. Sinuses/Orbits: Mild polypoid maxillary sinus mucosal thickening. No acute abnormality of the orbits. Other: None. IMPRESSION: No acute intracranial abnormality. Electronically  Signed   By: Macy Mis M.D.   On: 03/05/2021 21:00   DG Chest Portable 1 View  Result Date: 03/05/2021 CLINICAL DATA:  Dyspnea EXAM: PORTABLE CHEST 1 VIEW COMPARISON:  11/14/2020 FINDINGS: Lungs are clear save for minimal left basilar scarring. No pneumothorax or pleural effusion. Mild cardiomegaly is stable when accounting for changes in patient positioning. Pulmonary vascularity is normal. No acute bone abnormality. IMPRESSION: No active disease.  Stable cardiomegaly. Electronically Signed   By: Fidela Salisbury MD   On: 03/05/2021 21:16    Procedures .Critical Care Performed by: Gareth Morgan, MD Authorized by: Gareth Morgan, MD   Critical care provider statement:    Critical care time (minutes):  75   Critical care was time spent personally by me on the following activities:  Discussions with consultants, evaluation of patient's response to treatment, examination of patient, ordering and performing treatments and interventions, ordering and review of laboratory studies, ordering and review of radiographic studies, pulse oximetry, re-evaluation of patient's condition, obtaining history from patient or surrogate and review of old charts Angiocath insertion  Date/Time: 03/06/2021 1:39 AM Performed by: Gareth Morgan, MD Authorized by: Gareth Morgan, MD  Consent: Verbal consent obtained. Consent given by: patient Patient tolerance: patient tolerated the procedure well with no immediate complications      Medications Ordered in ED Medications  cefTRIAXone (ROCEPHIN) 1 g in sodium chloride 0.9 % 100 mL IVPB (has no administration in time range)  naloxone [facility]) injection 0.4 mg (0.4 mg Intravenous Given 03/05/21 2228)  calcium gluconate 1 g/ 50 mL sodium chloride IVPB (0 g Intravenous Stopped 03/06/21 0112)  insulin aspart (novoLOG) injection 5 Units (5 Units Intravenous Given 03/06/21 0007)  dextrose 50 % solution 50 mL (50 mLs Intravenous Given 03/06/21 0007)  sodium  zirconium cyclosilicate (LOKELMA) packet 10 g (10 g Oral Given 03/06/21 0005)    ED Course  I have reviewed the triage vital signs and the nursing notes.  Pertinent labs & imaging results that were available during my care of the patient were reviewed by me and considered in my medical decision making (see chart for details).    MDM Rules/Calculators/A&P                          45yo female with a history of diabetes, hypertension, ESRD on dialysis Tuesday Thursday Saturday who was seen in the emergency department on May 20 with concern for left flank pain, she was diagnosed with a urinary tract infection, and presents today with concern for altered mental status, found to have oxygen saturation 77% on room air on arrival to the emergency department.  Labs significant for hypercarbic respiratory failure with a CO2 in the 60s and pH of 7.2.  She is initially very somnolent on my exam.  Given 0.4 mg of Narcan with improvement of her mental status.  Suspect altered mental status is multifactorial in the setting of receiving morphine in the emergency department on Friday, missing dialysis, not using her CPAP with OSA, possible obesity hypoventilation, with symptoms of hypercarbia, uremia, and opiate use (per family during ED visit Friday) Consider sepsis secondary to UTI.  Labs significant for potassium of 6, BUN in the 130s.  Her oxygenation improved and  she is on 2 L.  Chest x-ray does not show signs of volume overload.  Suspect her hypoxia secondary to bradypnea.  Given insulin, dextrose, calcium and Lokelma.  Discussed with Dr. Joelyn Oms with plan for dialysis first thing tomorrow AM. Will continue to monitor respiratory status and admit to [facility].  Given rocephin with concern for UTI, lactate pending.     Final Clinical Impression(s) / ED Diagnoses Final diagnoses:  Altered mental status, unspecified altered mental status type  Acute respiratory failure with hypoxia and hypercarbia (HCC)   Hyperkalemia  Adverse effect of drug, initial encounter  Urinary tract infection without hematuria, site unspecified  Uremia    Rx / DC Orders ED Discharge Orders    None       Gareth Morgan, MD 03/06/21 [phone]

## 2021-03-05 NOTE — ED Provider Notes (Signed)
Emergency Medicine Provider Triage Evaluation Note  Autumn Mccormick 45 y.o. female was evaluated in triage.  Patient unable to provide any history.  She keeps repeating things saying "I cannot breathe."  I talked to her son over the phone who states that patient was here on Friday and was diagnosed UTI and discharged home with medications.  Son reports that since discharge home, patient has been confused, not herself. NO fevers, vomiting.   EM LEVEL 5 CAVEAT   Review of Systems  EM LEVEL 5 CAVEAT   Physical Exam  BP 134/82   Pulse 70   Temp 98.2 F (36.8 C) (Oral)   Resp 18   Ht '5\' 4"'$  (1.626 m)   Wt 65.8 kg   SpO2 100%   BMI 24.89 kg/m  Gen:   Awake, but confused.  HEENT:  Atraumatic. Difficulty assessing posterior oropharynx due to body habiuts  Resp:  Increased work of breathing.  Cardiac:  Normal rate  Abd:   Nondistended, nontender  MSK:   Moves extremities without difficulty  Neuro:  Disoriented.  Patient has difficulty answering questions.  Other:      Medical Decision Making  Medically screening exam initiated at 8:11 PM  Appropriate orders placed.  Autumn Mccormick was informed that the remainder of the evaluation will be completed by another provider, this initial triage assessment does not replace that evaluation. They are counseled that they will need to remain in the ED until the completion of their workup, including full H&P and results of any tests.  Risks of leaving the emergency department prior to completion of treatment were discussed. Patient was advised to inform ED staff if they are leaving before their treatment is complete. The patient acknowledged these risks and time was allowed for questions.     The patient appears stable so that the remainder of the MSE may be completed by another provider.  Patient initially who was reading 77% on room air.  She was placed on O2.  During my evaluation, turned O2 off and patient desatted into the mid 80s.  Patient was placed  back on 4 L O2 with improvement in her O2 sats.  She does not wear any oxygen at home.  8:13 PM: Notified Agricultural consultant Vassie Loll) that patient needed to be evaluated in the main ED  Clinical Impression  AMS, SOB   Portions of this note were generated with [employer]. Dictation errors may occur despite best attempts at proofreading.      Volanda Napoleon, PA-C 03/05/21 2014    Arnaldo Natal, MD 03/06/21 [phone]

## 2021-03-05 NOTE — ED Notes (Signed)
Pt transported for imaging with portable oxygen.

## 2021-03-05 NOTE — ED Triage Notes (Signed)
Pt presents from home. Son called due to patient not being able to answer questions and hallucinating. Son states starting Friday patient was acting confused. Dx with UTI recently. Pt repeatedly stating she does not feel good and is having trouble breathing, initial oxygen reading 77% on room air.

## 2021-03-06 DIAGNOSIS — G9341 Metabolic encephalopathy: Secondary | ICD-10-CM | POA: Diagnosis not present

## 2021-03-06 LAB — SARS CORONAVIRUS 2 (TAT 6-24 HRS): SARS Coronavirus 2: NEGATIVE

## 2021-03-06 LAB — POTASSIUM: Potassium: 5 mmol/L (ref 3.5–5.1)

## 2021-03-06 LAB — GLUCOSE, CAPILLARY
Glucose-Capillary: 74 mg/dL (ref 70–99)
Glucose-Capillary: 90 mg/dL (ref 70–99)
Glucose-Capillary: 120 mg/dL — ABNORMAL HIGH (ref 70–99)
Glucose-Capillary: 101 mg/dL — ABNORMAL HIGH (ref 70–99)
Glucose-Capillary: 182 mg/dL — ABNORMAL HIGH (ref 70–99)

## 2021-03-06 LAB — URINALYSIS, ROUTINE W REFLEX MICROSCOPIC
Bilirubin Urine: NEGATIVE
Glucose, UA: NEGATIVE mg/dL
Ketones, ur: NEGATIVE mg/dL
Nitrite: NEGATIVE
Protein, ur: 100 mg/dL — AB
RBC / HPF: >50 RBC/hpf — ABNORMAL HIGH (ref 0–5)
Specific Gravity, Urine: 1.014 (ref 1.005–1.030)
Squamous Epithelial / HPF: >50 — ABNORMAL HIGH (ref 0–5)
WBC, UA: >50 WBC/hpf — ABNORMAL HIGH (ref 0–5)
pH: 5 (ref 5.0–8.0)

## 2021-03-06 LAB — CBC
HCT: 39.5 % (ref 36.0–46.0)
Hemoglobin: 11.8 g/dL — ABNORMAL LOW (ref 12.0–15.0)
MCH: 27.7 pg (ref 26.0–34.0)
MCHC: 29.9 g/dL — ABNORMAL LOW (ref 30.0–36.0)
MCV: 92.7 fL (ref 80.0–100.0)
Platelets: 265 10*3/uL (ref 150–400)
RBC: 4.26 MIL/uL (ref 3.87–5.11)
RDW: 16.1 % — ABNORMAL HIGH (ref 11.5–15.5)
WBC: 12.3 10*3/uL — ABNORMAL HIGH (ref 4.0–10.5)
nRBC: 1.1 % — ABNORMAL HIGH (ref 0.0–0.2)

## 2021-03-06 LAB — RENAL FUNCTION PANEL
Albumin: 3.4 g/dL — ABNORMAL LOW (ref 3.5–5.0)
Anion gap: 18 — ABNORMAL HIGH (ref 5–15)
BUN: 143 mg/dL — ABNORMAL HIGH (ref 6–20)
CO2: 25 mmol/L (ref 22–32)
Calcium: 8.7 mg/dL — ABNORMAL LOW (ref 8.9–10.3)
Chloride: 96 mmol/L — ABNORMAL LOW (ref 98–111)
Creatinine, Ser: 15.47 mg/dL — ABNORMAL HIGH (ref 0.44–1.00)
GFR, Estimated: 3 mL/min — ABNORMAL LOW (ref >60)
Glucose, Bld: 67 mg/dL — ABNORMAL LOW (ref 70–99)
Phosphorus: 11.7 mg/dL — ABNORMAL HIGH (ref 2.5–4.6)
Potassium: 6.1 mmol/L — ABNORMAL HIGH (ref 3.5–5.1)
Sodium: 139 mmol/L (ref 135–145)

## 2021-03-06 LAB — HEMOGLOBIN A1C
Hgb A1c MFr Bld: 6.9 % — ABNORMAL HIGH (ref 4.8–5.6)
Mean Plasma Glucose: 151.33 mg/dL

## 2021-03-06 LAB — RESP PANEL BY RT-PCR (FLU A&B, COVID) ARPGX2
Influenza A by PCR: NEGATIVE
Influenza B by PCR: NEGATIVE
SARS Coronavirus 2 by RT PCR: NEGATIVE

## 2021-03-06 LAB — CREATININE, SERUM
Creatinine, Ser: 16.05 mg/dL — ABNORMAL HIGH (ref 0.44–1.00)
GFR, Estimated: 3 mL/min — ABNORMAL LOW (ref >60)

## 2021-03-06 LAB — HEPATITIS B SURFACE ANTIGEN: Hepatitis B Surface Ag: NONREACTIVE

## 2021-03-06 LAB — HIV ANTIBODY (ROUTINE TESTING W REFLEX): HIV Screen 4th Generation wRfx: NONREACTIVE

## 2021-03-06 LAB — AMMONIA: Ammonia: 25 umol/L (ref 9–35)

## 2021-03-06 LAB — LACTIC ACID, PLASMA: Lactic Acid, Venous: 0.8 mmol/L (ref 0.5–1.9)

## 2021-03-06 MED ORDER — LOSARTAN POTASSIUM 50 MG PO TABS
100.0000 mg | ORAL_TABLET | Freq: Every day | ORAL | Status: DC
  Administered 2021-03-06: 100 mg via ORAL
  Filled 2021-03-06: qty 2

## 2021-03-06 MED ORDER — CALCIUM CARBONATE ANTACID 1250 MG/5ML PO SUSP
500.0000 mg | Freq: Four times a day (QID) | ORAL | Status: DC | PRN
  Filled 2021-03-06: qty 5

## 2021-03-06 MED ORDER — ONDANSETRON HCL 4 MG/2ML IJ SOLN: 4.0000 mg | Freq: Four times a day (QID) | INTRAMUSCULAR | Status: DC | PRN

## 2021-03-06 MED ORDER — ACETAMINOPHEN 325 MG PO TABS
650.0000 mg | ORAL_TABLET | Freq: Four times a day (QID) | ORAL | Status: DC | PRN
  Administered 2021-03-06 – 2021-03-07 (×2): 650 mg via ORAL
  Filled 2021-03-06 (×2): qty 2

## 2021-03-06 MED ORDER — SODIUM ZIRCONIUM CYCLOSILICATE 10 G PO PACK
10.0000 g | PACK | Freq: Once | ORAL | Status: AC
  Administered 2021-03-06: 10 g via ORAL
  Filled 2021-03-06: qty 1

## 2021-03-06 MED ORDER — INSULIN GLARGINE 100 UNIT/ML ~~LOC~~ SOLN
40.0000 [IU] | Freq: Every evening | SUBCUTANEOUS | Status: DC
  Filled 2021-03-06: qty 0.4

## 2021-03-06 MED ORDER — LIDOCAINE-PRILOCAINE 2.5-2.5 % EX CREA: 1.0000 "application " | TOPICAL_CREAM | CUTANEOUS | Status: DC | PRN

## 2021-03-06 MED ORDER — CAMPHOR-MENTHOL 0.5-0.5 % EX LOTN: 1.0000 "application " | TOPICAL_LOTION | Freq: Three times a day (TID) | CUTANEOUS | Status: DC | PRN

## 2021-03-06 MED ORDER — NEPRO/CARBSTEADY PO LIQD: 237.0000 mL | Freq: Three times a day (TID) | ORAL | Status: DC | PRN

## 2021-03-06 MED ORDER — CALCIUM ACETATE (PHOS BINDER) 667 MG PO CAPS
1334.0000 mg | ORAL_CAPSULE | Freq: Three times a day (TID) | ORAL | Status: DC
  Administered 2021-03-06 – 2021-03-07 (×4): 1334 mg via ORAL
  Filled 2021-03-06 (×5): qty 2

## 2021-03-06 MED ORDER — DOCUSATE SODIUM 283 MG RE ENEM
1.0000 | ENEMA | RECTAL | Status: DC | PRN
  Filled 2021-03-06: qty 1

## 2021-03-06 MED ORDER — HEPARIN SODIUM (PORCINE) 5000 UNIT/ML IJ SOLN
5000.0000 [IU] | Freq: Three times a day (TID) | INTRAMUSCULAR | Status: DC
  Filled 2021-03-06: qty 1

## 2021-03-06 MED ORDER — LIDOCAINE HCL (PF) 1 % IJ SOLN: 5.0000 mL | INTRAMUSCULAR | Status: DC | PRN

## 2021-03-06 MED ORDER — HEPARIN SODIUM (PORCINE) 1000 UNIT/ML DIALYSIS: 1000.0000 [IU] | INTRAMUSCULAR | Status: DC | PRN

## 2021-03-06 MED ORDER — HYDROXYZINE HCL 25 MG PO TABS: 25.0000 mg | ORAL_TABLET | Freq: Three times a day (TID) | ORAL | Status: DC | PRN

## 2021-03-06 MED ORDER — SODIUM CHLORIDE 0.9 % IV SOLN: 100.0000 mL | INTRAVENOUS | Status: DC | PRN

## 2021-03-06 MED ORDER — INSULIN ASPART 100 UNIT/ML IJ SOLN: 0.0000 [IU] | Freq: Every day | INTRAMUSCULAR | Status: DC

## 2021-03-06 MED ORDER — PENTAFLUOROPROP-TETRAFLUOROETH EX AERO: 1.0000 "application " | INHALATION_SPRAY | CUTANEOUS | Status: DC | PRN

## 2021-03-06 MED ORDER — SODIUM CHLORIDE 0.9 % IV SOLN
INTRAVENOUS | Status: DC | PRN
  Administered 2021-03-06: 250 mL via INTRAVENOUS

## 2021-03-06 MED ORDER — INSULIN GLARGINE 100 UNIT/ML ~~LOC~~ SOLN
20.0000 [IU] | Freq: Every evening | SUBCUTANEOUS | Status: DC
  Administered 2021-03-06: 20 [IU] via SUBCUTANEOUS
  Filled 2021-03-06 (×3): qty 0.2

## 2021-03-06 MED ORDER — CHLORHEXIDINE GLUCONATE CLOTH 2 % EX PADS
6.0000 | MEDICATED_PAD | Freq: Every day | CUTANEOUS | Status: DC
  Administered 2021-03-07: 6 via TOPICAL

## 2021-03-06 MED ORDER — ATORVASTATIN CALCIUM 10 MG PO TABS
20.0000 mg | ORAL_TABLET | Freq: Every evening | ORAL | Status: DC
  Administered 2021-03-06: 20 mg via ORAL
  Filled 2021-03-06: qty 2

## 2021-03-06 MED ORDER — INSULIN ASPART 100 UNIT/ML IJ SOLN: 0.0000 [IU] | Freq: Three times a day (TID) | INTRAMUSCULAR | Status: DC

## 2021-03-06 MED ORDER — SORBITOL 70 % SOLN: 30.0000 mL | Status: DC | PRN

## 2021-03-06 MED ORDER — ACETAMINOPHEN 650 MG RE SUPP: 650.0000 mg | Freq: Four times a day (QID) | RECTAL | Status: DC | PRN

## 2021-03-06 MED ORDER — ZOLPIDEM TARTRATE 5 MG PO TABS: 5.0000 mg | ORAL_TABLET | Freq: Every evening | ORAL | Status: DC | PRN

## 2021-03-06 MED ORDER — SODIUM CHLORIDE 0.9 % IV SOLN
1.0000 g | Freq: Once | INTRAVENOUS | Status: AC
  Administered 2021-03-06: 1 g via INTRAVENOUS
  Filled 2021-03-06: qty 10

## 2021-03-06 MED ORDER — CARVEDILOL 25 MG PO TABS
25.0000 mg | ORAL_TABLET | Freq: Two times a day (BID) | ORAL | Status: DC
  Administered 2021-03-06 – 2021-03-07 (×3): 25 mg via ORAL
  Filled 2021-03-06 (×3): qty 1

## 2021-03-06 MED ORDER — ONDANSETRON HCL 4 MG PO TABS: 4.0000 mg | ORAL_TABLET | Freq: Four times a day (QID) | ORAL | Status: DC | PRN

## 2021-03-06 NOTE — Progress Notes (Signed)
Pt finished HD treatment, reached her UF goal of 3 L without issues. Orientation was the same as admission, some confusion/disorientation but aware at times of situation and date/time and self. Some complaints of nausea and feeling hot intermittently. Vitals stable on 2L nasal cannula. Hemostasis achieved, some spotting initially, but dressing intact, reinforced due to pt movement.  Report given to Mr Randall Hiss, RN on 3E

## 2021-03-06 NOTE — Plan of Care (Signed)
Outpatient HD orders:   [facility]) EDW 148 kg  2K /2.5 ca bath  4 hours BF 450 DF 600 Last HD on 5/17 with post weight of 148.5 kg  Heparin 4000 unit bolus no maintenance b/c has prolonged bleeding at end    9 mcg zemplar per tx (due to hectorol shortage) aranesp 65 mcg weekly - missed last dose   She has missed several treatments per her outpatient unit.  Last tx on 5/17.  Missed 5/19 and 5/21 tx.  Pt had stated to them due to diarrhea and she was to be evaluated for same.  They've not had issues with her access  Assess needs daily and may need tx tomorrow  Claudia Desanctis, MD 03/06/2021 2:48 PM

## 2021-03-06 NOTE — Progress Notes (Signed)
[facility] Progress Note  Name: Autumn Mccormick MRN: [id number] DOB: 1975-10-25   Subjective:  Seen and examined on dialysis.  Blood pressure 104/56 and HR 65. LUE AVF  In use.   Tolerating goal.  Procedure supervised.  She was turned up to 3 liters oxygen while sleeping.   Review of systems:  Difficult given sleepy but she denies n/v, denies shortness of breath or chest pain  ------------ Background on consult:  29F ESRD THS [place] via LUE AVF presented to the ED with confusion and dyspnea.  Patient was recently seen on 5/20 because of flank pain and was treated for UTI.  Apparently at that time she also received some morphine.  Since that time of discharge from the previous ED visit she has had persistent confusion and then developed some dyspnea.  She missed her HD treatment on 5/21.  She was hypoxic when evaluated by the EMS.  In the ED at the current evaluation she was also found to have hypercapnic respiratory failure with improvement after administration of Narcan.  Evaluation in the ED revealed potassium of 6.0, bicarbonate of 23, BUN of 132 with a creatinine of 15.9.  ABG was seven-point 2/64/106.  EKG without peaked T waves or prolongation of PR or QRS intervals.    Intake/Output Summary (Last 24 hours) at 03/06/2021 0904 Last data filed at 03/06/2021 0800 Gross per 24 hour  Intake 605.47 ml  Output 0 ml  Net 605.47 ml    Vitals:  Vitals:   03/06/21 0200 03/06/21 0213 03/06/21 0800 03/06/21 0809  BP:  (!) 162/84 (!) 151/80 (!) 171/60  Pulse:   67 71  Resp:  '18 16 16  '$ Temp: 98.4 F (36.9 C)  98.4 F (36.9 C) 98.4 F (36.9 C)  TempSrc: Oral  Oral Oral  SpO2:  97% 95% 93%  Weight:  (!) 148.1 kg  (!) 149.7 kg  Height:  '5\' 5"'$  (1.651 m)       Physical Exam:  General adult female in bed in no acute distress HEENT normocephalic atraumatic extraocular movements intact sclera anicteric Neck supple trachea midline Lungs clear but reduced;  unlabored; on 3 liters oxygen with sleep  Heart S1S2 no rub Abdomen soft nontender obese habitus Extremities trace lower extremity edema  Neuro - awakes to voice but falls asleep easily; oriented to person, year, and location Access LUE AVF in use  Medications reviewed   Labs:  BMP Latest Ref Rng & Units 03/06/2021 03/06/2021 03/05/2021  Glucose 70 - 99 mg/dL 67(L) - 95  BUN 6 - 20 mg/dL 143(H) - 132(H)  Creatinine 0.44 - 1.00 mg/dL 15.47(H) 16.05(H) 15.90(H)  Sodium 135 - 145 mmol/L 139 - 140  Potassium 3.5 - 5.1 mmol/L 6.1(H) - 6.0(H)  Chloride 98 - 111 mmol/L 96(L) - 95(L)  CO2 22 - 32 mmol/L 25 - 23  Calcium 8.9 - 10.3 mg/dL 8.7(L) - 9.0     Assessment/Plan:   1. ESRD THS LUE AVF, missed 5/21, sig azotemia.  If difficulty with cannulation or labs do not improve consider fistulogram - set tx at 3.5 hours today (up from 3).  She had no issues with cannulation today 2. Hyperkalemia - HD today.  Check potassium 1500  3. Hypercapenic / hypoxic RF, likely some component of narcotic related, improved with narcan.  Discontinued the ambien 4. Recent UTI on keflex as outpt 5. CKD-BMD - will obtain outpatient rx.  Continue binders - resume phoslo 6. Anemia CKD - no  indication for ESA 7. HTN - UF with HD  Claudia Desanctis, MD 03/06/2021 9:17 AM

## 2021-03-06 NOTE — Progress Notes (Signed)
PROGRESS NOTE    Autumn Mccormick  [id number] DOB: 1976/02/03 DOA: 03/05/2021 PCP: Lin Landsman, MD     Brief Narrative:  Autumn Mccormick is a 45 y.o. female with medical history significant of end-stage renal disease on hemodialysis Tuesdays Thursdays and Saturdays, hypertension, hyperlipidemia, GERD, who apparently missed her hemodialysis on Saturday secondary to altered mental status.  Patient also to some morphine inadvertently.  She is not on that by prescription.  She was brought in confused and hypoxic.  Sats were apparently in the 80s when EMS arrived.  Her son called and patient was unable to answer him.  She was hallucinating.  Symptoms started on Friday.  He initially thought it was a UTI that she was recently diagnosed with.  She is taking Keflex for that and she will have completed treatment by now.  She is currently somnolent and not able to give history.  Sats were in the 70s at 77% when she first arrived the ER.  Currently titrated down to 2 L of oxygen and sats in the 90s.  Patient also received Narcan and appears to be doing better.  She has been admitted to [facility] for dialysis.  Potassium was 6.0.    Nephrology was consulted.  New events last 24 hours / Subjective: Patient seen in dialysis.  She remains somewhat confused and lethargic.  She is easily arousable, is oriented to self and year but not to place.  She is unable to tell me why she presented to the hospital.  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)   Acute respiratory failure with hypoxia and hypercapnia (HCC)   Anemia due to chronic kidney disease   ESRD (end stage renal disease) on dialysis (HCC)   Essential (primary) hypertension   Acute metabolic encephalopathy -In  setting of morphine use and ESRD, missing dialysis -Patient was given Narcan in the emergency department with some improvement in mentation -Continue to monitor  Hyperkalemia -Dialysis completed today -Repeat K pending   ESRD -Nephrology following  Hypertension -Continue Coreg, losartan  HLD -Continue lipitor   Acute hypoxemic respiratory failure -Initially was found to be satting in the 80s on room air, required nasal cannula O2  Diabetes mellitus type 2, insulin-dependent -Ha1c 6.9 -Lantus and NovoLog, lantus dose adjusted today  OSA -Continue CPAP nightly  Pyuria -Completed outpatient antibiotics for UTI -Urine culture pending     DVT prophylaxis: Refusing subq hep  Place and maintain sequential compression device Start: 03/06/21 1345  Code Status:     Code Status Orders  (From admission, onward)         Start     Ordered   03/06/21 0215  Full code  Continuous        03/06/21 0214        Code Status History    This patient has a current code status but no historical code status.   Advance Care Planning Activity     Family Communication: None at bedside Disposition Plan:  Status is: Inpatient  Remains inpatient appropriate because:Altered mental status   Dispo: The patient is from: Home              Anticipated d/c is to: Home  Patient currently is not medically stable to d/c.   Difficult to place patient No      Consultants:   Nephrology    Antimicrobials:  Anti-infectives (From admission, onward)   Start     Dose/Rate Route Frequency Ordered Stop   03/06/21 0200  cefTRIAXone (ROCEPHIN) 1 g in sodium chloride 0.9 % 100 mL IVPB        1 g 200 mL/hr over 30 Minutes Intravenous  Once 03/06/21 0145 03/06/21 0602        Objective: Vitals:   03/06/21 1100 03/06/21 1130 03/06/21 1200 03/06/21 1209  BP: 122/61 (!) 150/70 124/60 136/68  Pulse: 63 63 63 62  Resp: '13 17 14 14  '$ Temp:    (!) 97.3 F (36.3 C)  TempSrc:     Oral  SpO2: 97% 95% 95% 96%  Weight:    (!) 146.7 kg  Height:        Intake/Output Summary (Last 24 hours) at 03/06/2021 1400 Last data filed at 03/06/2021 1209 Gross per 24 hour  Intake 605.47 ml  Output 2912 ml  Net -2306.53 ml   Filed Weights   03/06/21 0213 03/06/21 0809 03/06/21 1209  Weight: (!) 148.1 kg (!) 149.7 kg (!) 146.7 kg    Examination:  General exam: Appears calm and comfortable, lethargic  Respiratory system: Clear to auscultation. Respiratory effort normal. No respiratory distress. No conversational dyspnea.  Cardiovascular system: S1 & S2 heard, RRR. No murmurs. No pedal edema. Gastrointestinal system: Abdomen is nondistended, soft and nontender. Normal bowel sounds heard. Central nervous system: Alert and oriented to self and year only  Extremities: Symmetric in appearance  Skin: No rashes, lesions or ulcers on exposed skin  Data Reviewed: I have personally reviewed following labs and imaging studies  CBC: Recent Labs  Lab 03/02/21 0727 03/05/21 2137 03/06/21 0320  WBC 9.7 11.6* 12.3*  NEUTROABS 8.3* 9.1*  --   HGB 11.2* 12.1 11.8*  HCT 37.9 41.1 39.5  MCV 92.4 94.1 92.7  PLT 244 267 [id number]   Basic Metabolic Panel: Recent Labs  Lab 03/02/21 0727 03/05/21 2137 03/06/21 0320  NA 138 140 139  K 5.1 6.0* 6.1*  CL 97* 95* 96*  CO2 '27 23 25  '$ GLUCOSE 168* 95 67*  BUN 93* 132* 143*  CREATININE 10.73* 15.90* 15.47*  16.05*  CALCIUM 9.1 9.0 8.7*  PHOS  --   --  11.7*   GFR: Estimated Creatinine Clearance: 6.4 mL/min (A) (by C-G formula based on SCr of 16.05 mg/dL (H)). Liver Function Tests: Recent Labs  Lab 03/02/21 0727 03/05/21 2137 03/06/21 0320  AST 16 112*  --   ALT 15 231*  --   ALKPHOS 80 115  --   BILITOT 0.3 0.8  --   PROT 8.1 8.2*  --   ALBUMIN 3.8 3.8 3.4*   Recent Labs  Lab 03/02/21 0727  LIPASE 39   No results for input(s): AMMONIA in the last 168 hours. Coagulation Profile: No results for input(s): INR, PROTIME in the  last 168 hours. Cardiac Enzymes: No results for input(s): CKTOTAL, CKMB, CKMBINDEX, TROPONINI in the last 168 hours. BNP (last 3 results) No results for input(s): PROBNP in the last 8760 hours. HbA1C: Recent Labs    03/06/21 0320  HGBA1C 6.9*   CBG: Recent Labs  Lab 03/05/21 2048 03/06/21 0247 03/06/21 0550 03/06/21 1034  GLUCAP 85 74 90 120*   Lipid Profile: No results for input(s): CHOL, HDL, LDLCALC, TRIG, CHOLHDL, LDLDIRECT in the  last 72 hours. Thyroid Function Tests: No results for input(s): TSH, T4TOTAL, FREET4, T3FREE, THYROIDAB in the last 72 hours. Anemia Panel: No results for input(s): VITAMINB12, FOLATE, FERRITIN, TIBC, IRON, RETICCTPCT in the last 72 hours. Sepsis Labs: Recent Labs  Lab 03/05/21 2137 03/06/21 0320  LATICACIDVEN 1.5 0.8    Recent Results (from the past 240 hour(s))  SARS CORONAVIRUS 2 (TAT 6-24 HRS) Nasopharyngeal Nasopharyngeal Swab     Status: None   Collection Time: 03/05/21 11:39 PM   Specimen: Nasopharyngeal Swab  Result Value Ref Range Status   SARS Coronavirus 2 NEGATIVE NEGATIVE Final    Comment: (NOTE) SARS-CoV-2 target nucleic acids are NOT DETECTED.  The SARS-CoV-2 RNA is generally detectable in upper and lower respiratory specimens during the acute phase of infection. Negative results do not preclude SARS-CoV-2 infection, do not rule out co-infections with other pathogens, and should not be used as the sole basis for treatment or other patient management decisions. Negative results must be combined with clinical observations, patient history, and epidemiological information. The expected result is Negative.  Fact Sheet for Patients: SugarRoll.be  Fact Sheet for Healthcare Providers: https://www.woods-mathews.com/  This test is not yet approved or cleared by the [place] FDA and  has been authorized for detection and/or diagnosis of SARS-CoV-2 by FDA under an Emergency Use  Authorization (EUA). This EUA will remain  in effect (meaning this test can be used) for the duration of the COVID-19 declaration under Se ction 564(b)(1) of the Act, 21 U.S.C. section 360bbb-3(b)(1), unless the authorization is terminated or revoked sooner.  Performed at [facility], [place] [address], [place], [place] [address]   Resp Panel by RT-PCR (Flu A&B, Covid) Nasopharyngeal Swab     Status: None   Collection Time: 03/06/21 12:24 AM   Specimen: Nasopharyngeal Swab; Nasopharyngeal(NP) swabs in vial transport medium  Result Value Ref Range Status   SARS Coronavirus 2 by RT PCR NEGATIVE NEGATIVE Final    Comment: (NOTE) SARS-CoV-2 target nucleic acids are NOT DETECTED.  The SARS-CoV-2 RNA is generally detectable in upper respiratory specimens during the acute phase of infection. The lowest concentration of SARS-CoV-2 viral copies this assay can detect is 138 copies/mL. A negative result does not preclude SARS-Cov-2 infection and should not be used as the sole basis for treatment or other patient management decisions. A negative result may occur with  improper specimen collection/handling, submission of specimen other than nasopharyngeal swab, presence of viral mutation(s) within the areas targeted by this assay, and inadequate number of viral copies(<138 copies/mL). A negative result must be combined with clinical observations, patient history, and epidemiological information. The expected result is Negative.  Fact Sheet for Patients:  EntrepreneurPulse.com.au  Fact Sheet for Healthcare Providers:  IncredibleEmployment.be  This test is no t yet approved or cleared by the [place] FDA and  has been authorized for detection and/or diagnosis of SARS-CoV-2 by FDA under an Emergency Use Authorization (EUA). This EUA will remain  in effect (meaning this test can be used) for the duration of the COVID-19 declaration under Section  564(b)(1) of the Act, 21 U.S.C.section 360bbb-3(b)(1), unless the authorization is terminated  or revoked sooner.       Influenza A by PCR NEGATIVE NEGATIVE Final   Influenza B by PCR NEGATIVE NEGATIVE Final    Comment: (NOTE) The Xpert Xpress SARS-CoV-2/FLU/RSV plus assay is intended as an aid in the diagnosis of influenza from Nasopharyngeal swab specimens and should not be used as a sole basis  for treatment. Nasal washings and aspirates are unacceptable for Xpert Xpress SARS-CoV-2/FLU/RSV testing.  Fact Sheet for Patients: EntrepreneurPulse.com.au  Fact Sheet for Healthcare Providers: IncredibleEmployment.be  This test is not yet approved or cleared by the [place] FDA and has been authorized for detection and/or diagnosis of SARS-CoV-2 by FDA under an Emergency Use Authorization (EUA). This EUA will remain in effect (meaning this test can be used) for the duration of the COVID-19 declaration under Section 564(b)(1) of the Act, 21 U.S.C. section 360bbb-3(b)(1), unless the authorization is terminated or revoked.  Performed at [facility], [place] [address] [place], [place] [address]       Radiology Studies: CT Head Wo Contrast  Result Date: 03/05/2021 CLINICAL DATA:  Delirium EXAM: CT HEAD WITHOUT CONTRAST TECHNIQUE: Contiguous axial images were obtained from the base of the skull through the vertex without intravenous contrast. COMPARISON:  None. FINDINGS: Brain: There is no acute intracranial hemorrhage, mass effect, or edema. Gray-white differentiation is preserved. There is no extra-axial fluid collection. Ventricles and sulci are within normal limits in size and configuration. Vascular: There is atherosclerotic calcification at the skull base. Skull: Calvarium is unremarkable. Sinuses/Orbits: Mild polypoid maxillary sinus mucosal thickening. No acute abnormality of the orbits. Other: None. IMPRESSION: No acute  intracranial abnormality. Electronically Signed   By: Macy Mis M.D.   On: 03/05/2021 21:00   DG Chest Portable 1 View  Result Date: 03/05/2021 CLINICAL DATA:  Dyspnea EXAM: PORTABLE CHEST 1 VIEW COMPARISON:  11/14/2020 FINDINGS: Lungs are clear save for minimal left basilar scarring. No pneumothorax or pleural effusion. Mild cardiomegaly is stable when accounting for changes in patient positioning. Pulmonary vascularity is normal. No acute bone abnormality. IMPRESSION: No active disease.  Stable cardiomegaly. Electronically Signed   By: Fidela Salisbury MD   On: 03/05/2021 21:16      Scheduled Meds: . calcium acetate  1,334 mg Oral TID WC  . insulin aspart  0-5 Units Subcutaneous QHS  . insulin aspart  0-6 Units Subcutaneous TID WC  . insulin glargine  40 Units Subcutaneous QPM   Continuous Infusions: . sodium chloride 10 mL/hr at 03/06/21 0800     LOS: 1 day      Time spent: 25 minutes   Dessa Phi, DO [facility] 03/06/2021, 2:00 PM   Available via Epic secure chat 7am-7pm After these hours, please refer to coverage provider listed on [email or web address]

## 2021-03-06 NOTE — Progress Notes (Signed)
RT note. Patient placed on auto cpap 20/5 with 2L bled in, patient sat 94% w/ stable VS. RT will continue to monitor.

## 2021-03-06 NOTE — Progress Notes (Signed)
PIV consult: Cancel, per RN. Site established by provider.

## 2021-03-06 NOTE — Progress Notes (Signed)
  PROGRESS NOTE  Reevaluated patient.  She has returned back to her room after dialysis.  She is much more awake, alert, not as somnolent.  She remains confused.  She is oriented to self, states that she received morphine when she was at [facility] long emergency department.  She had been evaluated at [facility] long ED on 5/20 for left lower abdominal pain.  CT did not show acute surgical issues, urinalysis suggestive of UTI.  Patient was discharged home with cefixime, Norco.  It does appear that she received multiple IV morphine dosing during her ED visit.  I will add on ammonia to her pending labs.  Dessa Phi, DO [facility] 03/06/2021, 2:54 PM  Available via Epic secure chat 7am-7pm After these hours, please refer to coverage provider listed on [email or web address]

## 2021-03-06 NOTE — Progress Notes (Signed)
Inpatient Diabetes Program Recommendations  AACE/ADA: New Consensus Statement on Inpatient Glycemic Control (2015)  Target Ranges:  Prepandial:   less than 140 mg/dL      Peak postprandial:   less than 180 mg/dL (1-2 hours)      Critically ill patients:  140 - 180 mg/dL   Lab Results  Component Value Date   GLUCAP 120 (H) 03/06/2021   HGBA1C 6.9 (H) 03/06/2021    Review of Glycemic Control Results for SHARONNE, HOLTZER (MRN [id number]) as of 03/06/2021 14:00  Ref. Range 03/05/2021 20:48 03/06/2021 02:47 03/06/2021 05:50 03/06/2021 10:34  Glucose-Capillary Latest Ref Range: 70 - 99 mg/dL 85 74 90 120 (H)   Diabetes history:  DM2 Outpatient Diabetes medications:  Lantus 40 units QHS Glipizide 5 mg daily Current orders for Inpatient glycemic control:  Lantus 40 units QHS, Novolog 0-6 units TID and 0-5 QHS  Inpatient Diabetes Program Recommendations:     Patient to receive Lantus 40 units this evening.  Please consider reducing dose to 50% of home dose-Lantus 20 units QHS.  Will continue to follow while inpatient.  Thank you, Reche Dixon, RN, BSN Diabetes Coordinator Inpatient Diabetes Program [phone] (team pager from 8a-5p)

## 2021-03-07 DIAGNOSIS — I10 Essential (primary) hypertension: Secondary | ICD-10-CM

## 2021-03-07 DIAGNOSIS — J9601 Acute respiratory failure with hypoxia: Secondary | ICD-10-CM

## 2021-03-07 DIAGNOSIS — J9602 Acute respiratory failure with hypercapnia: Secondary | ICD-10-CM

## 2021-03-07 DIAGNOSIS — Z992 Dependence on renal dialysis: Secondary | ICD-10-CM

## 2021-03-07 DIAGNOSIS — N186 End stage renal disease: Secondary | ICD-10-CM

## 2021-03-07 DIAGNOSIS — G9341 Metabolic encephalopathy: Secondary | ICD-10-CM | POA: Diagnosis not present

## 2021-03-07 LAB — URINE CULTURE: Culture: NO GROWTH

## 2021-03-07 LAB — HEPATIC FUNCTION PANEL
ALT: 139 U/L — ABNORMAL HIGH (ref 0–44)
AST: 39 U/L (ref 15–41)
Albumin: 3 g/dL — ABNORMAL LOW (ref 3.5–5.0)
Alkaline Phosphatase: 96 U/L (ref 38–126)
Bilirubin, Direct: 0.1 mg/dL (ref 0.0–0.2)
Indirect Bilirubin: 0.8 mg/dL (ref 0.3–0.9)
Total Bilirubin: 0.9 mg/dL (ref 0.3–1.2)
Total Protein: 7 g/dL (ref 6.5–8.1)

## 2021-03-07 LAB — BASIC METABOLIC PANEL
Anion gap: 14 (ref 5–15)
BUN: 94 mg/dL — ABNORMAL HIGH (ref 6–20)
CO2: 27 mmol/L (ref 22–32)
Calcium: 8.6 mg/dL — ABNORMAL LOW (ref 8.9–10.3)
Chloride: 96 mmol/L — ABNORMAL LOW (ref 98–111)
Creatinine, Ser: 12.45 mg/dL — ABNORMAL HIGH (ref 0.44–1.00)
GFR, Estimated: 3 mL/min — ABNORMAL LOW (ref >60)
Glucose, Bld: 128 mg/dL — ABNORMAL HIGH (ref 70–99)
Potassium: 5 mmol/L (ref 3.5–5.1)
Sodium: 137 mmol/L (ref 135–145)

## 2021-03-07 LAB — CBC
HCT: 37.6 % (ref 36.0–46.0)
Hemoglobin: 11.1 g/dL — ABNORMAL LOW (ref 12.0–15.0)
MCH: 27.4 pg (ref 26.0–34.0)
MCHC: 29.5 g/dL — ABNORMAL LOW (ref 30.0–36.0)
MCV: 92.8 fL (ref 80.0–100.0)
Platelets: 235 10*3/uL (ref 150–400)
RBC: 4.05 MIL/uL (ref 3.87–5.11)
RDW: 16 % — ABNORMAL HIGH (ref 11.5–15.5)
WBC: 11.3 10*3/uL — ABNORMAL HIGH (ref 4.0–10.5)
nRBC: 0.6 % — ABNORMAL HIGH (ref 0.0–0.2)

## 2021-03-07 LAB — GLUCOSE, CAPILLARY
Glucose-Capillary: 124 mg/dL — ABNORMAL HIGH (ref 70–99)
Glucose-Capillary: 125 mg/dL — ABNORMAL HIGH (ref 70–99)
Glucose-Capillary: 165 mg/dL — ABNORMAL HIGH (ref 70–99)
Glucose-Capillary: 195 mg/dL — ABNORMAL HIGH (ref 70–99)

## 2021-03-07 MED ORDER — PARICALCITOL 5 MCG/ML IV SOLN: 9.0000 ug | INTRAVENOUS | Status: DC

## 2021-03-07 MED ORDER — CHLORHEXIDINE GLUCONATE CLOTH 2 % EX PADS: 6.0000 | MEDICATED_PAD | Freq: Every day | CUTANEOUS | Status: DC

## 2021-03-07 NOTE — Progress Notes (Signed)
[facility] Progress Note  Name: Autumn Mccormick MRN: [id number] DOB: 1976/03/20   Subjective:  Last HD on 5/24 with 2.9 kg UF.  She had 0.5 liters UOP over 5/24 charted.  She feels much better and felt better after dialysis yesterday.  States the morphine had made her really confused but she does remember me from yesterday. We discussed not to take unless cardiac chest pain and to discuss her ESRD with any provider.    Review of systems:  Shortness of breath better Denies chest pain  Denies n/v Not on oxygen at home and has been on 2 liters here this am  ------------ Background on consult:  89F ESRD THS [place] via LUE AVF presented to the ED with confusion and dyspnea.  Patient was recently seen on 5/20 because of flank pain and was treated for UTI.  Apparently at that time she also received some morphine.  Since that time of discharge from the previous ED visit she has had persistent confusion and then developed some dyspnea.  She missed her HD treatment on 5/21.  She was hypoxic when evaluated by the EMS.  In the ED at the current evaluation she was also found to have hypercapnic respiratory failure with improvement after administration of Narcan.  Evaluation in the ED revealed potassium of 6.0, bicarbonate of 23, BUN of 132 with a creatinine of 15.9.  ABG was seven-point 2/64/106.  EKG without peaked T waves or prolongation of PR or QRS intervals.    Intake/Output Summary (Last 24 hours) at 03/07/2021 0845 Last data filed at 03/07/2021 0500 Gross per 24 hour  Intake 240 ml  Output 3412 ml  Net -3172 ml    Vitals:  Vitals:   03/07/21 0034 03/07/21 0328 03/07/21 0338 03/07/21 0742  BP: (!) 124/52  (!) 105/34 (!) 142/82  Pulse: (!) 57  (!) 55 60  Resp: '14  16 20  '$ Temp: 97.9 F (36.6 C)  97.8 F (36.6 C) 98.3 F (36.8 C)  TempSrc: Oral  Oral Oral  SpO2: 96%   94%  Weight:  (!) 147.6 kg    Height:         Physical Exam:  General adult female in  bed in no acute distress HEENT normocephalic atraumatic extraocular movements intact sclera anicteric Neck supple trachea midline Lungs clear but reduced; unlabored; on 2 liters oxygen with sleep  Heart S1S2 no rub Abdomen soft nontender obese habitus Extremities no pitting lower extremity edema  Neuro - alert and oriented x 3 provides hx and follows commands Access LUE AVF bruit and thrill   Medications reviewed   Labs:  BMP Latest Ref Rng & Units 03/07/2021 03/06/2021 03/06/2021  Glucose 70 - 99 mg/dL 128(H) - 67(L)  BUN 6 - 20 mg/dL 94(H) - 143(H)  Creatinine 0.44 - 1.00 mg/dL 12.45(H) - 15.47(H)  Sodium 135 - 145 mmol/L 137 - 139  Potassium 3.5 - 5.1 mmol/L 5.0 5.0 6.1(H)  Chloride 98 - 111 mmol/L 96(L) - 96(L)  CO2 22 - 32 mmol/L 27 - 25  Calcium 8.9 - 10.3 mg/dL 8.6(L) - 8.7(L)   Outpatient HD orders:  TTS [facility]) EDW 148 kg  2K /2.5 ca bath  4 hours BF 450 DF 600 Last HD on 5/17 with post weight of 148.5 kg  Heparin 4000 unit bolus no maintenance b/c has prolonged bleeding at end  Other meds:  9 mcg zemplar per tx (due to hectorol shortage); aranesp 65  mcg weekly - missed last dose outpatient   Assessment/Plan:   1. ESRD - HD per TTS normally. missed 5/21, sig azotemia.  Extra truncated HD treatment today (5/25) for clearance  2. Hyperkalemia - HD again today as above. Renal diet.  3. Hypercapenic / hypoxic RF, likely some component of narcotic related, improved with narcan.  Discontinued the ambien 4. 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  On phoslo. 6. Anemia CKD - no indication for ESA 7. HTN - UF with HD  Claudia Desanctis, MD 03/07/2021 9:04 AM

## 2021-03-07 NOTE — Progress Notes (Signed)
Renal Navigator faxed Discharge Summary and Nephrology note to patient's outpatient HD clinic/[place] to provide continuity of care.   Alphonzo Cruise, Portal Renal Navigator [phone]

## 2021-03-07 NOTE — Discharge Summary (Signed)
Physician Discharge Summary  Autumn Mccormick [id number] DOB: [birth year] DOA: 03/05/2021  PCP: Lin Landsman, MD  Admit date: 03/05/2021 Discharge date: 03/07/2021  Time spent: 50 minutes  Recommendations for Outpatient Follow-up:  1. 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)   Acute respiratory failure with hypoxia and hypercapnia (HCC)   Anemia due to chronic kidney disease   ESRD (end stage renal disease) on dialysis [facility])   Essential (primary) hypertension   Discharge Condition: Stable  Diet recommendation: Carb modified diet  Filed Weights   03/06/21 0809 03/06/21 1209 03/07/21 0328  Weight: (!) 149.7 kg (!) 146.7 kg (!) 147.6 kg    History of present illness:  45 year old female with history of end-stage renal disease on hemodialysis, TTS, hypertension, hyperlipidemia, GERD who apparently missed hemodialysis on Saturday 6 [place] to altered mental status.  She also looks morphine inadvertently.  Patient was brought in confused and hypoxic.  O2 sats were in the 80s when EMS arrived.  She was taking Keflex for recently diagnosed UTI as outpatient.  Hospital Course:  Metabolic encephalopathy-resolved, likely from morphine use and missed hemodialysis.  Patient was given Narcan in the ED with some improvement in mentation.  Currently she is back to baseline.  ESRD-patient receives dialysis in the hospital.  We will continue with her outpatient dialysis schedule Tuesday Thursday and Saturday.  Hypertension-blood pressure is stable, continue Coreg, losartan.  Hyperlipidemia-continue Lipitor.  Diabetes mellitus type 2-hemoglobin A1c 6.9.  Continue Lantus at home dose of 40 units subcu daily.  OSA-continue CPAP at  bedtime.  Pyuria-patient completed outpatient antibiotics for UTI.  Urine culture showed no growth.    Procedures:    Consultations:  Nephrology  Discharge Exam: Vitals:   03/07/21 0742 03/07/21 1135  BP: (!) 142/82 133/73  Pulse: 60 62  Resp: 20 16  Temp: 98.3 F (36.8 C) 98.4 F (36.9 C)  SpO2: 94% (!) 84%    General: Appears in no acute distress Cardiovascular: S1-S2, regular Respiratory: Clear to auscultation bilaterally  Discharge Instructions   Discharge Instructions    Diet - low sodium heart healthy   Complete by: As directed    Increase activity slowly   Complete by: As directed    No wound care   Complete by: As directed      Allergies as of 03/07/2021      Reactions   Augmentin [amoxicillin-pot Clavulanate] Diarrhea   Metformin And Related Diarrhea   Other Other (See Comments)   All cold medicine       Medication List    STOP taking these medications   HYDROcodone-acetaminophen 5-325 MG tablet Commonly known as: NORCO/VICODIN     TAKE these medications   acetaminophen 325 MG tablet Commonly known as: TYLENOL Take 650-975 mg by mouth as needed (pain).   amitriptyline 25 MG tablet Commonly known as: ELAVIL Take 25 mg by mouth at bedtime.   calcium acetate 667 MG capsule Commonly known as: PHOSLO Take 1,334 mg by mouth with breakfast, with lunch, and with evening meal.   carvedilol 25 MG tablet Commonly known as: COREG Take 25 mg by mouth 2 (two) times daily.   Lantus SoloStar 100 UNIT/ML Solostar Pen Generic drug: insulin glargine Inject 40 Units into the skin every  evening.   losartan 100 MG tablet Commonly known as: COZAAR Take 100 mg by mouth daily.   MULTIVITAMIN ADULTS PO Take 1 tablet by mouth daily.   omeprazole 20 MG capsule Commonly known as: PRILOSEC Take 20 mg by mouth daily as needed (indigestion).   ondansetron 4 MG disintegrating tablet Commonly known as: Zofran ODT Take 1 tablet (4 mg total) by mouth  every 8 (eight) hours as needed for nausea or vomiting.      Allergies  Allergen Reactions  . Augmentin [Amoxicillin-Pot Clavulanate] Diarrhea  . Metformin And Related Diarrhea  . Other Other (See Comments)    All cold medicine     Follow-up Information    Lin Landsman, MD Follow up in 2 week(s).   Specialty: Family Medicine Contact information: [place] [address] [phone]                The results of significant diagnostics from this hospitalization (including imaging, microbiology, ancillary and laboratory) are listed below for reference.    Significant Diagnostic Studies: CT Head Wo Contrast  Result Date: 03/05/2021 CLINICAL DATA:  Delirium EXAM: CT HEAD WITHOUT CONTRAST TECHNIQUE: Contiguous axial images were obtained from the base of the skull through the vertex without intravenous contrast. COMPARISON:  None. FINDINGS: Brain: There is no acute intracranial hemorrhage, mass effect, or edema. Gray-white differentiation is preserved. There is no extra-axial fluid collection. Ventricles and sulci are within normal limits in size and configuration. Vascular: There is atherosclerotic calcification at the skull base. Skull: Calvarium is unremarkable. Sinuses/Orbits: Mild polypoid maxillary sinus mucosal thickening. No acute abnormality of the orbits. Other: None. IMPRESSION: No acute intracranial abnormality. Electronically Signed   By: Macy Mis M.D.   On: 03/05/2021 21:00   US Transvaginal Non-OB  Result Date: 03/02/2021 CLINICAL DATA:  Abdominal pain since yesterday.  Prior hysterectomy. EXAM: TRANSABDOMINAL AND TRANSVAGINAL ULTRASOUND OF PELVIS DOPPLER ULTRASOUND OF OVARIES TECHNIQUE: Both transabdominal and transvaginal ultrasound examinations of the pelvis were performed. Transabdominal technique was performed for global imaging of the pelvis including uterus, ovaries, adnexal regions, and pelvic cul-de-sac. It was necessary to proceed with  endovaginal exam following the transabdominal exam to visualize the ovaries. Color and duplex Doppler ultrasound was utilized to evaluate blood flow to the ovaries. COMPARISON:  CT abdomen and pelvis 03/02/2021. Pelvic ultrasound 11/26/2011. FINDINGS: Examination is limited by patient body habitus. Uterus Surgically absent. Right ovary Measurements: 2.9 x 1.4 x 1.8 cm = volume: 3.8 mL. Suboptimally visualized without evidence of a mass. Left ovary Measurements: 2.4 x 1.4 x 1.7 cm = volume: 3.0 mL. Suboptimally visualized without evidence of a mass Pulsed Doppler evaluation of both ovaries is limited but demonstrates gross arterial and venous flow bilaterally. Other findings No abnormal free fluid. IMPRESSION: 1. Limited assessment. No evidence of ovarian mass. Grossly symmetric blood flow in the ovaries. 2. Status post hysterectomy. Electronically Signed   By: Logan Bores M.D.   On: 03/02/2021 12:34   US Pelvis Complete  Result Date: 03/02/2021 CLINICAL DATA:  Abdominal pain since yesterday.  Prior hysterectomy. EXAM: TRANSABDOMINAL AND TRANSVAGINAL ULTRASOUND OF PELVIS DOPPLER ULTRASOUND OF OVARIES TECHNIQUE: Both transabdominal and transvaginal ultrasound examinations of the pelvis were performed. Transabdominal technique was performed for global imaging of the pelvis including uterus, ovaries, adnexal regions, and pelvic cul-de-sac. It was necessary to proceed with endovaginal exam following the transabdominal exam to visualize the ovaries. Color and duplex Doppler ultrasound was utilized to evaluate blood flow to the ovaries. COMPARISON:  CT abdomen and pelvis 03/02/2021. Pelvic ultrasound 11/26/2011. FINDINGS: Examination is limited by patient body habitus. Uterus Surgically absent. Right ovary Measurements: 2.9 x 1.4 x 1.8 cm = volume: 3.8 mL. Suboptimally visualized without evidence of a mass. Left ovary Measurements: 2.4 x 1.4 x 1.7 cm = volume: 3.0 mL. Suboptimally visualized without evidence of a mass  Pulsed Doppler evaluation of both ovaries is limited but demonstrates gross arterial and venous flow bilaterally. Other findings No abnormal free fluid. IMPRESSION: 1. Limited assessment. No evidence of ovarian mass. Grossly symmetric blood flow in the ovaries. 2. Status post hysterectomy. Electronically Signed   By: Logan Bores M.D.   On: 03/02/2021 12:34   [place] Art/Ven Flow Abd Pelv Doppler  Result Date: 03/02/2021 CLINICAL DATA:  Abdominal pain since yesterday.  Prior hysterectomy. EXAM: TRANSABDOMINAL AND TRANSVAGINAL ULTRASOUND OF PELVIS DOPPLER ULTRASOUND OF OVARIES TECHNIQUE: Both transabdominal and transvaginal ultrasound examinations of the pelvis were performed. Transabdominal technique was performed for global imaging of the pelvis including uterus, ovaries, adnexal regions, and pelvic cul-de-sac. It was necessary to proceed with endovaginal exam following the transabdominal exam to visualize the ovaries. Color and duplex Doppler ultrasound was utilized to evaluate blood flow to the ovaries. COMPARISON:  CT abdomen and pelvis 03/02/2021. Pelvic ultrasound 11/26/2011. FINDINGS: Examination is limited by patient body habitus. Uterus Surgically absent. Right ovary Measurements: 2.9 x 1.4 x 1.8 cm = volume: 3.8 mL. Suboptimally visualized without evidence of a mass. Left ovary Measurements: 2.4 x 1.4 x 1.7 cm = volume: 3.0 mL. Suboptimally visualized without evidence of a mass Pulsed Doppler evaluation of both ovaries is limited but demonstrates gross arterial and venous flow bilaterally. Other findings No abnormal free fluid. IMPRESSION: 1. Limited assessment. No evidence of ovarian mass. Grossly symmetric blood flow in the ovaries. 2. Status post hysterectomy. Electronically Signed   By: Logan Bores M.D.   On: 03/02/2021 12:34   DG Chest Portable 1 View  Result Date: 03/05/2021 CLINICAL DATA:  Dyspnea EXAM: PORTABLE CHEST 1 VIEW COMPARISON:  11/14/2020 FINDINGS: Lungs are clear save for minimal left  basilar scarring. No pneumothorax or pleural effusion. Mild cardiomegaly is stable when accounting for changes in patient positioning. Pulmonary vascularity is normal. No acute bone abnormality. IMPRESSION: No active disease.  Stable cardiomegaly. Electronically Signed   By: Fidela Salisbury MD   On: 03/05/2021 21:16   CT Renal Stone Study  Result Date: 03/02/2021 CLINICAL DATA:  Left lower quadrant and flank pain. EXAM: CT ABDOMEN AND PELVIS WITHOUT CONTRAST TECHNIQUE: Multidetector CT imaging of the abdomen and pelvis was performed following the standard protocol without IV contrast. COMPARISON:  08/08/2018 FINDINGS: Lower chest: 4 mm right middle lobe pulmonary nodule identified on image 2 of series 4. Hepatobiliary: The liver shows diffusely decreased attenuation suggesting fat deposition. No focal abnormality in the liver on this study without intravenous contrast. Gallbladder is surgically absent. No intrahepatic or extrahepatic biliary dilation. Pancreas: No focal mass lesion. No dilatation of the main duct. No intraparenchymal cyst. No peripancreatic edema. Spleen: No splenomegaly. No focal mass lesion. Adrenals/Urinary Tract: No adrenal nodule or mass. No stones are seen in the left kidney or ureter. Mild fullness of the left intrarenal collecting system and proximal ureter evident without identifiable obstructive etiology. No stones are seen in the right kidney with a double-J right internal ureteral stent device again noted. Bladder is nondistended. Stomach/Bowel: Stomach is unremarkable. No gastric wall thickening. No evidence of outlet obstruction. Duodenum is normally positioned as is the ligament  of Treitz. No small bowel wall thickening. No small bowel dilatation. The terminal ileum is normal. The appendix is not well visualized, but there is no edema or inflammation in the region of the cecum. No gross colonic mass. No colonic wall thickening. Vascular/Lymphatic: There is abdominal aortic  atherosclerosis without aneurysm. There is no gastrohepatic or hepatoduodenal ligament lymphadenopathy. No retroperitoneal or mesenteric lymphadenopathy. No pelvic sidewall lymphadenopathy. Reproductive: Uterus surgically absent.  There is no adnexal mass. Other: No intraperitoneal free fluid. Musculoskeletal: Small to moderate paraumbilical hernia contains only fat. No worrisome lytic or sclerotic osseous abnormality. IMPRESSION: 1. No acute findings in the abdomen or pelvis. Specifically, no findings to explain the patient's history of left lower quadrant pain. 2. Double-J right internal ureteral stent device in situ. 3. Mild fullness of the left intrarenal collecting system and proximal ureter without identifiable obstructive etiology. Correlation with renal function testing recommended in urology follow-up likely warranted. 4. Hepatic steatosis. 5. Small to moderate paraumbilical hernia contains only fat. 6. Aortic Atherosclerosis (ICD10-I70.0). Electronically Signed   By: Misty Stanley M.D.   On: 03/02/2021 08:19    Microbiology: Recent Results (from the past 240 hour(s))  SARS CORONAVIRUS 2 (TAT 6-24 HRS) Nasopharyngeal Nasopharyngeal Swab     Status: None   Collection Time: 03/05/21 11:39 PM   Specimen: Nasopharyngeal Swab  Result Value Ref Range Status   SARS Coronavirus 2 NEGATIVE NEGATIVE Final    Comment: (NOTE) SARS-CoV-2 target nucleic acids are NOT DETECTED.  The SARS-CoV-2 RNA is generally detectable in upper and lower respiratory specimens during the acute phase of infection. Negative results do not preclude SARS-CoV-2 infection, do not rule out co-infections with other pathogens, and should not be used as the sole basis for treatment or other patient management decisions. Negative results must be combined with clinical observations, patient history, and epidemiological information. The expected result is Negative.  Fact Sheet for  Patients: SugarRoll.be  Fact Sheet for Healthcare Providers: https://www.woods-mathews.com/  This test is not yet approved or cleared by the [place] FDA and  has been authorized for detection and/or diagnosis of SARS-CoV-2 by FDA under an Emergency Use Authorization (EUA). This EUA will remain  in effect (meaning this test can be used) for the duration of the COVID-19 declaration under Se ction 564(b)(1) of the Act, 21 U.S.C. section 360bbb-3(b)(1), unless the authorization is terminated or revoked sooner.  Performed at [facility], [place] [address], [place], [place] [address]   Resp Panel by RT-PCR (Flu A&B, Covid) Nasopharyngeal Swab     Status: None   Collection Time: 03/06/21 12:24 AM   Specimen: Nasopharyngeal Swab; Nasopharyngeal(NP) swabs in vial transport medium  Result Value Ref Range Status   SARS Coronavirus 2 by RT PCR NEGATIVE NEGATIVE Final    Comment: (NOTE) SARS-CoV-2 target nucleic acids are NOT DETECTED.  The SARS-CoV-2 RNA is generally detectable in upper respiratory specimens during the acute phase of infection. The lowest concentration of SARS-CoV-2 viral copies this assay can detect is 138 copies/mL. A negative result does not preclude SARS-Cov-2 infection and should not be used as the sole basis for treatment or other patient management decisions. A negative result may occur with  improper specimen collection/handling, submission of specimen other than nasopharyngeal swab, presence of viral mutation(s) within the areas targeted by this assay, and inadequate number of viral copies(<138 copies/mL). A negative result must be combined with clinical observations, patient history, and epidemiological information. The expected result is Negative.  Fact Sheet for Patients:  EntrepreneurPulse.com.au  Fact Sheet for Healthcare Providers:  IncredibleEmployment.be  This test is  no t yet approved or cleared by the [place] FDA and  has been authorized for detection and/or diagnosis of SARS-CoV-2 by FDA under an Emergency Use Authorization (EUA). This EUA will remain  in effect (meaning this test can be used) for the duration of the COVID-19 declaration under Section 564(b)(1) of the Act, 21 U.S.C.section 360bbb-3(b)(1), unless the authorization is terminated  or revoked sooner.       Influenza A by PCR NEGATIVE NEGATIVE Final   Influenza B by PCR NEGATIVE NEGATIVE Final    Comment: (NOTE) The Xpert Xpress SARS-CoV-2/FLU/RSV plus assay is intended as an aid in the diagnosis of influenza from Nasopharyngeal swab specimens and should not be used as a sole basis for treatment. Nasal washings and aspirates are unacceptable for Xpert Xpress SARS-CoV-2/FLU/RSV testing.  Fact Sheet for Patients: EntrepreneurPulse.com.au  Fact Sheet for Healthcare Providers: IncredibleEmployment.be  This test is not yet approved or cleared by the [place] FDA and has been authorized for detection and/or diagnosis of SARS-CoV-2 by FDA under an Emergency Use Authorization (EUA). This EUA will remain in effect (meaning this test can be used) for the duration of the COVID-19 declaration under Section 564(b)(1) of the Act, 21 U.S.C. section 360bbb-3(b)(1), unless the authorization is terminated or revoked.  Performed at [facility], [place] [address] [place], [place] [address]   Urine culture     Status: None   Collection Time: 03/06/21  1:00 AM   Specimen: Urine, Clean Catch  Result Value Ref Range Status   Specimen Description   Final    URINE, CLEAN CATCH Performed at [facility], [place] [address] [place], [place] [address]    Special Requests   Final    NONE Performed at [facility], [place] [address] [place], [place] [address]    Culture   Final    NO GROWTH Performed  at [facility], [place] [address], [place], [place] [address]    Report Status 03/07/2021 FINAL  Final     Labs: Basic Metabolic Panel: Recent Labs  Lab 03/02/21 0727 03/05/21 2137 03/06/21 0320 03/06/21 1451 03/07/21 0200  NA 138 140 139  --  137  K 5.1 6.0* 6.1* 5.0 5.0  CL 97* 95* 96*  --  96*  CO2 '27 23 25  '$ --  27  GLUCOSE 168* 95 67*  --  128*  BUN 93* 132* 143*  --  94*  CREATININE 10.73* 15.90* 15.47*  16.05*  --  12.45*  CALCIUM 9.1 9.0 8.7*  --  8.6*  PHOS  --   --  11.7*  --   --    Liver Function Tests: Recent Labs  Lab 03/02/21 0727 03/05/21 2137 03/06/21 0320 03/07/21 0200  AST 16 112*  --  39  ALT 15 231*  --  139*  ALKPHOS 80 115  --  96  BILITOT 0.3 0.8  --  0.9  PROT 8.1 8.2*  --  7.0  ALBUMIN 3.8 3.8 3.4* 3.0*   Recent Labs  Lab 03/02/21 0727  LIPASE 39   Recent Labs  Lab 03/06/21 1451  AMMONIA 25   CBC: Recent Labs  Lab 03/02/21 0727 03/05/21 2137 03/06/21 0320 03/07/21 0200  WBC 9.7 11.6* 12.3* 11.3*  NEUTROABS 8.3* 9.1*  --   --   HGB 11.2* 12.1 11.8* 11.1*  HCT 37.9 41.1 39.5 37.6  MCV 92.4 94.1 92.7 92.8  PLT 244 267 265 235   Cardiac Enzymes: No results for input(s): CKTOTAL, CKMB, CKMBINDEX, TROPONINI in the last 168 hours. BNP: BNP (last 3 results) No results for input(s): BNP in the last 8760 hours.  ProBNP (last 3 results) No results for input(s): PROBNP in the last 8760 hours.  CBG: Recent Labs  Lab 03/06/21 1034 03/06/21 1654 03/06/21 2138 03/07/21 0555 03/07/21 1134  GLUCAP 120* 101* 182* 124* 125*       Signed:  Oswald Hillock MD.  [facility] 03/07/2021, 2:39 PM

## 2021-07-09 ENCOUNTER — Encounter (HOSPITAL_COMMUNITY): Payer: Self-pay | Admitting: Emergency Medicine

## 2021-07-09 ENCOUNTER — Emergency Department (HOSPITAL_COMMUNITY)
Admission: EM | Admit: 2021-07-09 | Discharge: 2021-07-10 | Disposition: A | Discharge status: Home / Self Care | Payer: Medicare Other | Attending: Emergency Medicine | Admitting: Emergency Medicine

## 2021-07-09 ENCOUNTER — Other Ambulatory Visit: Payer: Self-pay

## 2021-07-09 DIAGNOSIS — M79661 Pain in right lower leg: Secondary | ICD-10-CM | POA: Insufficient documentation

## 2021-07-09 DIAGNOSIS — M79662 Pain in left lower leg: Secondary | ICD-10-CM | POA: Diagnosis not present

## 2021-07-09 DIAGNOSIS — Z5321 Procedure and treatment not carried out due to patient leaving prior to being seen by health care provider: Secondary | ICD-10-CM | POA: Insufficient documentation

## 2021-07-09 NOTE — ED Triage Notes (Signed)
Patient reports knots behind her knees onset last week , denies injury , she added dry skin .

## 2021-07-09 NOTE — ED Provider Notes (Signed)
Emergency Medicine Provider Triage Evaluation Note  Autumn Mccormick , a 45 y.o. female  was evaluated in triage.  Pt complains of burning pain to BLE which began Saturday. Has noticed some "knots" to her distal posterior thighs just above the level of her knee. Palpation makes her pain worse, especially in these areas. No falls or trauma, fevers. Was seen for similar complaints and back pain at [facility] earlier today where she was treated with Dilaudid. Otherwise taking Tylenol for pain w/o relief.  Review of Systems  Positive: Leg pain Negative: Fever  Physical Exam  Ht '5\' 5"'$  (1.651 m)   Wt (!) 160 kg   BMI 58.70 kg/m  Gen:   Awake, no distress   Resp:  Normal effort  MSK:   Induration to distal posterior thighs which is tender. No erythema, heat to touch. Other:  Morbidly obese. Able to transition and ambulate, but with significant assistance.  Medical Decision Making  Medically screening exam initiated at 10:16 PM.  Appropriate orders placed.  Autumn Mccormick was informed that the remainder of the evaluation will be completed by another provider, this initial triage assessment does not replace that evaluation, and the importance of remaining in the ED until their evaluation is complete.  B/l leg pain   Antonietta Breach, Hershal Coria 07/09/21 2219    Dorie Rank, MD 07/09/21 [phone]

## 2021-07-10 NOTE — ED Notes (Signed)
Pt left AMA. Pt was encouraged to stay and tried to explain the wait time.

## 2021-12-15 DIAGNOSIS — D509 Iron deficiency anemia, unspecified: Secondary | ICD-10-CM | POA: Diagnosis not present

## 2021-12-15 DIAGNOSIS — N2581 Secondary hyperparathyroidism of renal origin: Secondary | ICD-10-CM | POA: Diagnosis not present

## 2021-12-15 DIAGNOSIS — N186 End stage renal disease: Secondary | ICD-10-CM | POA: Diagnosis not present

## 2021-12-15 DIAGNOSIS — D631 Anemia in chronic kidney disease: Secondary | ICD-10-CM | POA: Diagnosis not present

## 2021-12-18 DIAGNOSIS — N2581 Secondary hyperparathyroidism of renal origin: Secondary | ICD-10-CM | POA: Diagnosis not present

## 2021-12-18 DIAGNOSIS — D631 Anemia in chronic kidney disease: Secondary | ICD-10-CM | POA: Diagnosis not present

## 2021-12-18 DIAGNOSIS — N186 End stage renal disease: Secondary | ICD-10-CM | POA: Diagnosis not present

## 2021-12-18 DIAGNOSIS — D509 Iron deficiency anemia, unspecified: Secondary | ICD-10-CM | POA: Diagnosis not present

## 2021-12-20 DIAGNOSIS — D631 Anemia in chronic kidney disease: Secondary | ICD-10-CM | POA: Diagnosis not present

## 2021-12-20 DIAGNOSIS — D509 Iron deficiency anemia, unspecified: Secondary | ICD-10-CM | POA: Diagnosis not present

## 2021-12-20 DIAGNOSIS — N186 End stage renal disease: Secondary | ICD-10-CM | POA: Diagnosis not present

## 2021-12-20 DIAGNOSIS — N2581 Secondary hyperparathyroidism of renal origin: Secondary | ICD-10-CM | POA: Diagnosis not present

## 2021-12-22 DIAGNOSIS — N2581 Secondary hyperparathyroidism of renal origin: Secondary | ICD-10-CM | POA: Diagnosis not present

## 2021-12-22 DIAGNOSIS — D509 Iron deficiency anemia, unspecified: Secondary | ICD-10-CM | POA: Diagnosis not present

## 2021-12-22 DIAGNOSIS — D631 Anemia in chronic kidney disease: Secondary | ICD-10-CM | POA: Diagnosis not present

## 2021-12-22 DIAGNOSIS — N186 End stage renal disease: Secondary | ICD-10-CM | POA: Diagnosis not present

## 2021-12-25 DIAGNOSIS — N2581 Secondary hyperparathyroidism of renal origin: Secondary | ICD-10-CM | POA: Diagnosis not present

## 2021-12-25 DIAGNOSIS — D509 Iron deficiency anemia, unspecified: Secondary | ICD-10-CM | POA: Diagnosis not present

## 2021-12-25 DIAGNOSIS — N390 Urinary tract infection, site not specified: Secondary | ICD-10-CM | POA: Diagnosis not present

## 2021-12-25 DIAGNOSIS — N186 End stage renal disease: Secondary | ICD-10-CM | POA: Diagnosis not present

## 2021-12-25 DIAGNOSIS — D631 Anemia in chronic kidney disease: Secondary | ICD-10-CM | POA: Diagnosis not present

## 2021-12-27 DIAGNOSIS — D631 Anemia in chronic kidney disease: Secondary | ICD-10-CM | POA: Diagnosis not present

## 2021-12-27 DIAGNOSIS — N2581 Secondary hyperparathyroidism of renal origin: Secondary | ICD-10-CM | POA: Diagnosis not present

## 2021-12-27 DIAGNOSIS — N186 End stage renal disease: Secondary | ICD-10-CM | POA: Diagnosis not present

## 2021-12-27 DIAGNOSIS — D509 Iron deficiency anemia, unspecified: Secondary | ICD-10-CM | POA: Diagnosis not present

## 2021-12-29 DIAGNOSIS — N2581 Secondary hyperparathyroidism of renal origin: Secondary | ICD-10-CM | POA: Diagnosis not present

## 2021-12-29 DIAGNOSIS — D509 Iron deficiency anemia, unspecified: Secondary | ICD-10-CM | POA: Diagnosis not present

## 2021-12-29 DIAGNOSIS — D631 Anemia in chronic kidney disease: Secondary | ICD-10-CM | POA: Diagnosis not present

## 2021-12-29 DIAGNOSIS — N186 End stage renal disease: Secondary | ICD-10-CM | POA: Diagnosis not present

## 2022-01-01 DIAGNOSIS — D509 Iron deficiency anemia, unspecified: Secondary | ICD-10-CM | POA: Diagnosis not present

## 2022-01-01 DIAGNOSIS — D631 Anemia in chronic kidney disease: Secondary | ICD-10-CM | POA: Diagnosis not present

## 2022-01-01 DIAGNOSIS — N2581 Secondary hyperparathyroidism of renal origin: Secondary | ICD-10-CM | POA: Diagnosis not present

## 2022-01-01 DIAGNOSIS — N186 End stage renal disease: Secondary | ICD-10-CM | POA: Diagnosis not present

## 2022-01-03 DIAGNOSIS — N2581 Secondary hyperparathyroidism of renal origin: Secondary | ICD-10-CM | POA: Diagnosis not present

## 2022-01-03 DIAGNOSIS — D631 Anemia in chronic kidney disease: Secondary | ICD-10-CM | POA: Diagnosis not present

## 2022-01-03 DIAGNOSIS — N186 End stage renal disease: Secondary | ICD-10-CM | POA: Diagnosis not present

## 2022-01-03 DIAGNOSIS — D509 Iron deficiency anemia, unspecified: Secondary | ICD-10-CM | POA: Diagnosis not present

## 2022-01-05 DIAGNOSIS — N2581 Secondary hyperparathyroidism of renal origin: Secondary | ICD-10-CM | POA: Diagnosis not present

## 2022-01-05 DIAGNOSIS — D509 Iron deficiency anemia, unspecified: Secondary | ICD-10-CM | POA: Diagnosis not present

## 2022-01-05 DIAGNOSIS — D631 Anemia in chronic kidney disease: Secondary | ICD-10-CM | POA: Diagnosis not present

## 2022-01-05 DIAGNOSIS — N186 End stage renal disease: Secondary | ICD-10-CM | POA: Diagnosis not present

## 2022-01-08 DIAGNOSIS — D631 Anemia in chronic kidney disease: Secondary | ICD-10-CM | POA: Diagnosis not present

## 2022-01-08 DIAGNOSIS — N186 End stage renal disease: Secondary | ICD-10-CM | POA: Diagnosis not present

## 2022-01-08 DIAGNOSIS — N2581 Secondary hyperparathyroidism of renal origin: Secondary | ICD-10-CM | POA: Diagnosis not present

## 2022-01-08 DIAGNOSIS — D509 Iron deficiency anemia, unspecified: Secondary | ICD-10-CM | POA: Diagnosis not present

## 2022-01-11 DIAGNOSIS — Z992 Dependence on renal dialysis: Secondary | ICD-10-CM | POA: Diagnosis not present

## 2022-01-11 DIAGNOSIS — N186 End stage renal disease: Secondary | ICD-10-CM | POA: Diagnosis not present

## 2022-01-12 DIAGNOSIS — D509 Iron deficiency anemia, unspecified: Secondary | ICD-10-CM | POA: Diagnosis not present

## 2022-01-12 DIAGNOSIS — D631 Anemia in chronic kidney disease: Secondary | ICD-10-CM | POA: Diagnosis not present

## 2022-01-12 DIAGNOSIS — N186 End stage renal disease: Secondary | ICD-10-CM | POA: Diagnosis not present

## 2022-01-12 DIAGNOSIS — E785 Hyperlipidemia, unspecified: Secondary | ICD-10-CM | POA: Diagnosis not present

## 2022-01-12 DIAGNOSIS — N2581 Secondary hyperparathyroidism of renal origin: Secondary | ICD-10-CM | POA: Diagnosis not present

## 2022-01-15 DIAGNOSIS — N186 End stage renal disease: Secondary | ICD-10-CM | POA: Diagnosis not present

## 2022-01-15 DIAGNOSIS — E785 Hyperlipidemia, unspecified: Secondary | ICD-10-CM | POA: Diagnosis not present

## 2022-01-15 DIAGNOSIS — N2581 Secondary hyperparathyroidism of renal origin: Secondary | ICD-10-CM | POA: Diagnosis not present

## 2022-01-15 DIAGNOSIS — D509 Iron deficiency anemia, unspecified: Secondary | ICD-10-CM | POA: Diagnosis not present

## 2022-01-15 DIAGNOSIS — D631 Anemia in chronic kidney disease: Secondary | ICD-10-CM | POA: Diagnosis not present

## 2022-01-17 DIAGNOSIS — N2581 Secondary hyperparathyroidism of renal origin: Secondary | ICD-10-CM | POA: Diagnosis not present

## 2022-01-17 DIAGNOSIS — E785 Hyperlipidemia, unspecified: Secondary | ICD-10-CM | POA: Diagnosis not present

## 2022-01-17 DIAGNOSIS — E1122 Type 2 diabetes mellitus with diabetic chronic kidney disease: Secondary | ICD-10-CM | POA: Diagnosis not present

## 2022-01-17 DIAGNOSIS — D631 Anemia in chronic kidney disease: Secondary | ICD-10-CM | POA: Diagnosis not present

## 2022-01-17 DIAGNOSIS — N186 End stage renal disease: Secondary | ICD-10-CM | POA: Diagnosis not present

## 2022-01-17 DIAGNOSIS — D509 Iron deficiency anemia, unspecified: Secondary | ICD-10-CM | POA: Diagnosis not present

## 2022-01-19 DIAGNOSIS — N186 End stage renal disease: Secondary | ICD-10-CM | POA: Diagnosis not present

## 2022-01-19 DIAGNOSIS — D509 Iron deficiency anemia, unspecified: Secondary | ICD-10-CM | POA: Diagnosis not present

## 2022-01-19 DIAGNOSIS — N2581 Secondary hyperparathyroidism of renal origin: Secondary | ICD-10-CM | POA: Diagnosis not present

## 2022-01-19 DIAGNOSIS — E785 Hyperlipidemia, unspecified: Secondary | ICD-10-CM | POA: Diagnosis not present

## 2022-01-19 DIAGNOSIS — D631 Anemia in chronic kidney disease: Secondary | ICD-10-CM | POA: Diagnosis not present

## 2022-01-22 DIAGNOSIS — E785 Hyperlipidemia, unspecified: Secondary | ICD-10-CM | POA: Diagnosis not present

## 2022-01-22 DIAGNOSIS — N2581 Secondary hyperparathyroidism of renal origin: Secondary | ICD-10-CM | POA: Diagnosis not present

## 2022-01-22 DIAGNOSIS — D631 Anemia in chronic kidney disease: Secondary | ICD-10-CM | POA: Diagnosis not present

## 2022-01-22 DIAGNOSIS — N186 End stage renal disease: Secondary | ICD-10-CM | POA: Diagnosis not present

## 2022-01-22 DIAGNOSIS — D509 Iron deficiency anemia, unspecified: Secondary | ICD-10-CM | POA: Diagnosis not present

## 2022-01-24 DIAGNOSIS — D509 Iron deficiency anemia, unspecified: Secondary | ICD-10-CM | POA: Diagnosis not present

## 2022-01-24 DIAGNOSIS — N186 End stage renal disease: Secondary | ICD-10-CM | POA: Diagnosis not present

## 2022-01-24 DIAGNOSIS — D631 Anemia in chronic kidney disease: Secondary | ICD-10-CM | POA: Diagnosis not present

## 2022-01-24 DIAGNOSIS — E785 Hyperlipidemia, unspecified: Secondary | ICD-10-CM | POA: Diagnosis not present

## 2022-01-24 DIAGNOSIS — N2581 Secondary hyperparathyroidism of renal origin: Secondary | ICD-10-CM | POA: Diagnosis not present

## 2022-01-26 DIAGNOSIS — E785 Hyperlipidemia, unspecified: Secondary | ICD-10-CM | POA: Diagnosis not present

## 2022-01-26 DIAGNOSIS — D509 Iron deficiency anemia, unspecified: Secondary | ICD-10-CM | POA: Diagnosis not present

## 2022-01-26 DIAGNOSIS — N186 End stage renal disease: Secondary | ICD-10-CM | POA: Diagnosis not present

## 2022-01-26 DIAGNOSIS — N2581 Secondary hyperparathyroidism of renal origin: Secondary | ICD-10-CM | POA: Diagnosis not present

## 2022-01-26 DIAGNOSIS — D631 Anemia in chronic kidney disease: Secondary | ICD-10-CM | POA: Diagnosis not present

## 2022-01-29 DIAGNOSIS — N186 End stage renal disease: Secondary | ICD-10-CM | POA: Diagnosis not present

## 2022-01-29 DIAGNOSIS — N2581 Secondary hyperparathyroidism of renal origin: Secondary | ICD-10-CM | POA: Diagnosis not present

## 2022-01-29 DIAGNOSIS — D509 Iron deficiency anemia, unspecified: Secondary | ICD-10-CM | POA: Diagnosis not present

## 2022-01-29 DIAGNOSIS — D631 Anemia in chronic kidney disease: Secondary | ICD-10-CM | POA: Diagnosis not present

## 2022-01-29 DIAGNOSIS — E785 Hyperlipidemia, unspecified: Secondary | ICD-10-CM | POA: Diagnosis not present

## 2022-01-31 DIAGNOSIS — N2581 Secondary hyperparathyroidism of renal origin: Secondary | ICD-10-CM | POA: Diagnosis not present

## 2022-01-31 DIAGNOSIS — D631 Anemia in chronic kidney disease: Secondary | ICD-10-CM | POA: Diagnosis not present

## 2022-01-31 DIAGNOSIS — N186 End stage renal disease: Secondary | ICD-10-CM | POA: Diagnosis not present

## 2022-01-31 DIAGNOSIS — E785 Hyperlipidemia, unspecified: Secondary | ICD-10-CM | POA: Diagnosis not present

## 2022-01-31 DIAGNOSIS — D509 Iron deficiency anemia, unspecified: Secondary | ICD-10-CM | POA: Diagnosis not present

## 2022-02-02 DIAGNOSIS — N2581 Secondary hyperparathyroidism of renal origin: Secondary | ICD-10-CM | POA: Diagnosis not present

## 2022-02-02 DIAGNOSIS — D509 Iron deficiency anemia, unspecified: Secondary | ICD-10-CM | POA: Diagnosis not present

## 2022-02-02 DIAGNOSIS — E785 Hyperlipidemia, unspecified: Secondary | ICD-10-CM | POA: Diagnosis not present

## 2022-02-02 DIAGNOSIS — N186 End stage renal disease: Secondary | ICD-10-CM | POA: Diagnosis not present

## 2022-02-02 DIAGNOSIS — D631 Anemia in chronic kidney disease: Secondary | ICD-10-CM | POA: Diagnosis not present

## 2022-02-05 DIAGNOSIS — N2581 Secondary hyperparathyroidism of renal origin: Secondary | ICD-10-CM | POA: Diagnosis not present

## 2022-02-05 DIAGNOSIS — D509 Iron deficiency anemia, unspecified: Secondary | ICD-10-CM | POA: Diagnosis not present

## 2022-02-05 DIAGNOSIS — N186 End stage renal disease: Secondary | ICD-10-CM | POA: Diagnosis not present

## 2022-02-05 DIAGNOSIS — D631 Anemia in chronic kidney disease: Secondary | ICD-10-CM | POA: Diagnosis not present

## 2022-02-05 DIAGNOSIS — E785 Hyperlipidemia, unspecified: Secondary | ICD-10-CM | POA: Diagnosis not present

## 2022-02-06 DIAGNOSIS — N2581 Secondary hyperparathyroidism of renal origin: Secondary | ICD-10-CM | POA: Diagnosis not present

## 2022-02-06 DIAGNOSIS — E785 Hyperlipidemia, unspecified: Secondary | ICD-10-CM | POA: Diagnosis not present

## 2022-02-06 DIAGNOSIS — N186 End stage renal disease: Secondary | ICD-10-CM | POA: Diagnosis not present

## 2022-02-06 DIAGNOSIS — D509 Iron deficiency anemia, unspecified: Secondary | ICD-10-CM | POA: Diagnosis not present

## 2022-02-06 DIAGNOSIS — D631 Anemia in chronic kidney disease: Secondary | ICD-10-CM | POA: Diagnosis not present

## 2022-02-09 DIAGNOSIS — N2581 Secondary hyperparathyroidism of renal origin: Secondary | ICD-10-CM | POA: Diagnosis not present

## 2022-02-09 DIAGNOSIS — D509 Iron deficiency anemia, unspecified: Secondary | ICD-10-CM | POA: Diagnosis not present

## 2022-02-09 DIAGNOSIS — D631 Anemia in chronic kidney disease: Secondary | ICD-10-CM | POA: Diagnosis not present

## 2022-02-09 DIAGNOSIS — E785 Hyperlipidemia, unspecified: Secondary | ICD-10-CM | POA: Diagnosis not present

## 2022-02-09 DIAGNOSIS — N186 End stage renal disease: Secondary | ICD-10-CM | POA: Diagnosis not present

## 2022-02-10 DIAGNOSIS — Z992 Dependence on renal dialysis: Secondary | ICD-10-CM | POA: Diagnosis not present

## 2022-02-10 DIAGNOSIS — N186 End stage renal disease: Secondary | ICD-10-CM | POA: Diagnosis not present

## 2022-02-12 DIAGNOSIS — D631 Anemia in chronic kidney disease: Secondary | ICD-10-CM | POA: Diagnosis not present

## 2022-02-12 DIAGNOSIS — D509 Iron deficiency anemia, unspecified: Secondary | ICD-10-CM | POA: Diagnosis not present

## 2022-02-12 DIAGNOSIS — N186 End stage renal disease: Secondary | ICD-10-CM | POA: Diagnosis not present

## 2022-02-12 DIAGNOSIS — N2581 Secondary hyperparathyroidism of renal origin: Secondary | ICD-10-CM | POA: Diagnosis not present

## 2022-02-14 DIAGNOSIS — E1122 Type 2 diabetes mellitus with diabetic chronic kidney disease: Secondary | ICD-10-CM | POA: Diagnosis not present

## 2022-02-14 DIAGNOSIS — N186 End stage renal disease: Secondary | ICD-10-CM | POA: Diagnosis not present

## 2022-02-14 DIAGNOSIS — D509 Iron deficiency anemia, unspecified: Secondary | ICD-10-CM | POA: Diagnosis not present

## 2022-02-14 DIAGNOSIS — N2581 Secondary hyperparathyroidism of renal origin: Secondary | ICD-10-CM | POA: Diagnosis not present

## 2022-02-14 DIAGNOSIS — D631 Anemia in chronic kidney disease: Secondary | ICD-10-CM | POA: Diagnosis not present

## 2022-02-16 DIAGNOSIS — D509 Iron deficiency anemia, unspecified: Secondary | ICD-10-CM | POA: Diagnosis not present

## 2022-02-16 DIAGNOSIS — N186 End stage renal disease: Secondary | ICD-10-CM | POA: Diagnosis not present

## 2022-02-16 DIAGNOSIS — D631 Anemia in chronic kidney disease: Secondary | ICD-10-CM | POA: Diagnosis not present

## 2022-02-16 DIAGNOSIS — N2581 Secondary hyperparathyroidism of renal origin: Secondary | ICD-10-CM | POA: Diagnosis not present

## 2022-02-19 DIAGNOSIS — D509 Iron deficiency anemia, unspecified: Secondary | ICD-10-CM | POA: Diagnosis not present

## 2022-02-19 DIAGNOSIS — N2581 Secondary hyperparathyroidism of renal origin: Secondary | ICD-10-CM | POA: Diagnosis not present

## 2022-02-19 DIAGNOSIS — N186 End stage renal disease: Secondary | ICD-10-CM | POA: Diagnosis not present

## 2022-02-19 DIAGNOSIS — D631 Anemia in chronic kidney disease: Secondary | ICD-10-CM | POA: Diagnosis not present

## 2022-02-21 DIAGNOSIS — D509 Iron deficiency anemia, unspecified: Secondary | ICD-10-CM | POA: Diagnosis not present

## 2022-02-21 DIAGNOSIS — D631 Anemia in chronic kidney disease: Secondary | ICD-10-CM | POA: Diagnosis not present

## 2022-02-21 DIAGNOSIS — N2581 Secondary hyperparathyroidism of renal origin: Secondary | ICD-10-CM | POA: Diagnosis not present

## 2022-02-21 DIAGNOSIS — N186 End stage renal disease: Secondary | ICD-10-CM | POA: Diagnosis not present

## 2022-02-23 DIAGNOSIS — N186 End stage renal disease: Secondary | ICD-10-CM | POA: Diagnosis not present

## 2022-02-23 DIAGNOSIS — N2581 Secondary hyperparathyroidism of renal origin: Secondary | ICD-10-CM | POA: Diagnosis not present

## 2022-02-23 DIAGNOSIS — D509 Iron deficiency anemia, unspecified: Secondary | ICD-10-CM | POA: Diagnosis not present

## 2022-02-23 DIAGNOSIS — D631 Anemia in chronic kidney disease: Secondary | ICD-10-CM | POA: Diagnosis not present

## 2022-02-28 DIAGNOSIS — N186 End stage renal disease: Secondary | ICD-10-CM | POA: Diagnosis not present

## 2022-02-28 DIAGNOSIS — D631 Anemia in chronic kidney disease: Secondary | ICD-10-CM | POA: Diagnosis not present

## 2022-02-28 DIAGNOSIS — D509 Iron deficiency anemia, unspecified: Secondary | ICD-10-CM | POA: Diagnosis not present

## 2022-02-28 DIAGNOSIS — N2581 Secondary hyperparathyroidism of renal origin: Secondary | ICD-10-CM | POA: Diagnosis not present

## 2022-03-02 DIAGNOSIS — D631 Anemia in chronic kidney disease: Secondary | ICD-10-CM | POA: Diagnosis not present

## 2022-03-02 DIAGNOSIS — N186 End stage renal disease: Secondary | ICD-10-CM | POA: Diagnosis not present

## 2022-03-02 DIAGNOSIS — N2581 Secondary hyperparathyroidism of renal origin: Secondary | ICD-10-CM | POA: Diagnosis not present

## 2022-03-02 DIAGNOSIS — D509 Iron deficiency anemia, unspecified: Secondary | ICD-10-CM | POA: Diagnosis not present

## 2022-03-05 DIAGNOSIS — N2581 Secondary hyperparathyroidism of renal origin: Secondary | ICD-10-CM | POA: Diagnosis not present

## 2022-03-05 DIAGNOSIS — N186 End stage renal disease: Secondary | ICD-10-CM | POA: Diagnosis not present

## 2022-03-05 DIAGNOSIS — D631 Anemia in chronic kidney disease: Secondary | ICD-10-CM | POA: Diagnosis not present

## 2022-03-05 DIAGNOSIS — D509 Iron deficiency anemia, unspecified: Secondary | ICD-10-CM | POA: Diagnosis not present

## 2022-03-07 DIAGNOSIS — N2581 Secondary hyperparathyroidism of renal origin: Secondary | ICD-10-CM | POA: Diagnosis not present

## 2022-03-07 DIAGNOSIS — D509 Iron deficiency anemia, unspecified: Secondary | ICD-10-CM | POA: Diagnosis not present

## 2022-03-07 DIAGNOSIS — D631 Anemia in chronic kidney disease: Secondary | ICD-10-CM | POA: Diagnosis not present

## 2022-03-07 DIAGNOSIS — N186 End stage renal disease: Secondary | ICD-10-CM | POA: Diagnosis not present

## 2022-03-08 DIAGNOSIS — N2581 Secondary hyperparathyroidism of renal origin: Secondary | ICD-10-CM | POA: Diagnosis not present

## 2022-03-08 DIAGNOSIS — D509 Iron deficiency anemia, unspecified: Secondary | ICD-10-CM | POA: Diagnosis not present

## 2022-03-08 DIAGNOSIS — N186 End stage renal disease: Secondary | ICD-10-CM | POA: Diagnosis not present

## 2022-03-08 DIAGNOSIS — D631 Anemia in chronic kidney disease: Secondary | ICD-10-CM | POA: Diagnosis not present

## 2022-03-09 DIAGNOSIS — F32A Depression, unspecified: Secondary | ICD-10-CM | POA: Diagnosis not present

## 2022-03-09 DIAGNOSIS — R0683 Snoring: Secondary | ICD-10-CM | POA: Diagnosis not present

## 2022-03-09 DIAGNOSIS — I10 Essential (primary) hypertension: Secondary | ICD-10-CM | POA: Diagnosis not present

## 2022-03-09 DIAGNOSIS — G4733 Obstructive sleep apnea (adult) (pediatric): Secondary | ICD-10-CM | POA: Diagnosis not present

## 2022-03-12 DIAGNOSIS — N2581 Secondary hyperparathyroidism of renal origin: Secondary | ICD-10-CM | POA: Diagnosis not present

## 2022-03-12 DIAGNOSIS — D509 Iron deficiency anemia, unspecified: Secondary | ICD-10-CM | POA: Diagnosis not present

## 2022-03-12 DIAGNOSIS — D631 Anemia in chronic kidney disease: Secondary | ICD-10-CM | POA: Diagnosis not present

## 2022-03-12 DIAGNOSIS — N186 End stage renal disease: Secondary | ICD-10-CM | POA: Diagnosis not present

## 2022-03-13 DIAGNOSIS — N186 End stage renal disease: Secondary | ICD-10-CM | POA: Diagnosis not present

## 2022-03-13 DIAGNOSIS — Z992 Dependence on renal dialysis: Secondary | ICD-10-CM | POA: Diagnosis not present

## 2022-03-14 DIAGNOSIS — D631 Anemia in chronic kidney disease: Secondary | ICD-10-CM | POA: Diagnosis not present

## 2022-03-14 DIAGNOSIS — N186 End stage renal disease: Secondary | ICD-10-CM | POA: Diagnosis not present

## 2022-03-14 DIAGNOSIS — D509 Iron deficiency anemia, unspecified: Secondary | ICD-10-CM | POA: Diagnosis not present

## 2022-03-14 DIAGNOSIS — N2581 Secondary hyperparathyroidism of renal origin: Secondary | ICD-10-CM | POA: Diagnosis not present

## 2022-03-16 DIAGNOSIS — N186 End stage renal disease: Secondary | ICD-10-CM | POA: Diagnosis not present

## 2022-03-16 DIAGNOSIS — D509 Iron deficiency anemia, unspecified: Secondary | ICD-10-CM | POA: Diagnosis not present

## 2022-03-16 DIAGNOSIS — D631 Anemia in chronic kidney disease: Secondary | ICD-10-CM | POA: Diagnosis not present

## 2022-03-16 DIAGNOSIS — N2581 Secondary hyperparathyroidism of renal origin: Secondary | ICD-10-CM | POA: Diagnosis not present

## 2022-03-19 DIAGNOSIS — D631 Anemia in chronic kidney disease: Secondary | ICD-10-CM | POA: Diagnosis not present

## 2022-03-19 DIAGNOSIS — D509 Iron deficiency anemia, unspecified: Secondary | ICD-10-CM | POA: Diagnosis not present

## 2022-03-19 DIAGNOSIS — N2581 Secondary hyperparathyroidism of renal origin: Secondary | ICD-10-CM | POA: Diagnosis not present

## 2022-03-19 DIAGNOSIS — N186 End stage renal disease: Secondary | ICD-10-CM | POA: Diagnosis not present

## 2022-03-21 DIAGNOSIS — D631 Anemia in chronic kidney disease: Secondary | ICD-10-CM | POA: Diagnosis not present

## 2022-03-21 DIAGNOSIS — N2581 Secondary hyperparathyroidism of renal origin: Secondary | ICD-10-CM | POA: Diagnosis not present

## 2022-03-21 DIAGNOSIS — N186 End stage renal disease: Secondary | ICD-10-CM | POA: Diagnosis not present

## 2022-03-21 DIAGNOSIS — E1122 Type 2 diabetes mellitus with diabetic chronic kidney disease: Secondary | ICD-10-CM | POA: Diagnosis not present

## 2022-03-21 DIAGNOSIS — D509 Iron deficiency anemia, unspecified: Secondary | ICD-10-CM | POA: Diagnosis not present

## 2022-03-23 DIAGNOSIS — N2581 Secondary hyperparathyroidism of renal origin: Secondary | ICD-10-CM | POA: Diagnosis not present

## 2022-03-23 DIAGNOSIS — D509 Iron deficiency anemia, unspecified: Secondary | ICD-10-CM | POA: Diagnosis not present

## 2022-03-23 DIAGNOSIS — N186 End stage renal disease: Secondary | ICD-10-CM | POA: Diagnosis not present

## 2022-03-23 DIAGNOSIS — D631 Anemia in chronic kidney disease: Secondary | ICD-10-CM | POA: Diagnosis not present

## 2022-03-26 DIAGNOSIS — D631 Anemia in chronic kidney disease: Secondary | ICD-10-CM | POA: Diagnosis not present

## 2022-03-26 DIAGNOSIS — N186 End stage renal disease: Secondary | ICD-10-CM | POA: Diagnosis not present

## 2022-03-26 DIAGNOSIS — D509 Iron deficiency anemia, unspecified: Secondary | ICD-10-CM | POA: Diagnosis not present

## 2022-03-26 DIAGNOSIS — N2581 Secondary hyperparathyroidism of renal origin: Secondary | ICD-10-CM | POA: Diagnosis not present

## 2022-03-28 DIAGNOSIS — D509 Iron deficiency anemia, unspecified: Secondary | ICD-10-CM | POA: Diagnosis not present

## 2022-03-28 DIAGNOSIS — N186 End stage renal disease: Secondary | ICD-10-CM | POA: Diagnosis not present

## 2022-03-28 DIAGNOSIS — D631 Anemia in chronic kidney disease: Secondary | ICD-10-CM | POA: Diagnosis not present

## 2022-03-28 DIAGNOSIS — N2581 Secondary hyperparathyroidism of renal origin: Secondary | ICD-10-CM | POA: Diagnosis not present

## 2022-03-30 DIAGNOSIS — D631 Anemia in chronic kidney disease: Secondary | ICD-10-CM | POA: Diagnosis not present

## 2022-03-30 DIAGNOSIS — N2581 Secondary hyperparathyroidism of renal origin: Secondary | ICD-10-CM | POA: Diagnosis not present

## 2022-03-30 DIAGNOSIS — D509 Iron deficiency anemia, unspecified: Secondary | ICD-10-CM | POA: Diagnosis not present

## 2022-03-30 DIAGNOSIS — N186 End stage renal disease: Secondary | ICD-10-CM | POA: Diagnosis not present

## 2022-04-02 DIAGNOSIS — N186 End stage renal disease: Secondary | ICD-10-CM | POA: Diagnosis not present

## 2022-04-02 DIAGNOSIS — D509 Iron deficiency anemia, unspecified: Secondary | ICD-10-CM | POA: Diagnosis not present

## 2022-04-02 DIAGNOSIS — E113511 Type 2 diabetes mellitus with proliferative diabetic retinopathy with macular edema, right eye: Secondary | ICD-10-CM | POA: Diagnosis not present

## 2022-04-02 DIAGNOSIS — D631 Anemia in chronic kidney disease: Secondary | ICD-10-CM | POA: Diagnosis not present

## 2022-04-02 DIAGNOSIS — N2581 Secondary hyperparathyroidism of renal origin: Secondary | ICD-10-CM | POA: Diagnosis not present

## 2022-04-02 DIAGNOSIS — H35371 Puckering of macula, right eye: Secondary | ICD-10-CM | POA: Diagnosis not present

## 2022-04-02 DIAGNOSIS — H4311 Vitreous hemorrhage, right eye: Secondary | ICD-10-CM | POA: Diagnosis not present

## 2022-04-02 DIAGNOSIS — H43821 Vitreomacular adhesion, right eye: Secondary | ICD-10-CM | POA: Diagnosis not present

## 2022-04-04 DIAGNOSIS — N2581 Secondary hyperparathyroidism of renal origin: Secondary | ICD-10-CM | POA: Diagnosis not present

## 2022-04-04 DIAGNOSIS — D631 Anemia in chronic kidney disease: Secondary | ICD-10-CM | POA: Diagnosis not present

## 2022-04-04 DIAGNOSIS — D509 Iron deficiency anemia, unspecified: Secondary | ICD-10-CM | POA: Diagnosis not present

## 2022-04-04 DIAGNOSIS — N186 End stage renal disease: Secondary | ICD-10-CM | POA: Diagnosis not present

## 2022-04-04 IMAGING — CR DG CHEST 1V PORT
1 series · 1 of 1 positions shown · non-contrast
Comparison: 11/14/2020

CLINICAL DATA: Dyspnea

EXAM:
PORTABLE CHEST 1 VIEW

[x chest ap]
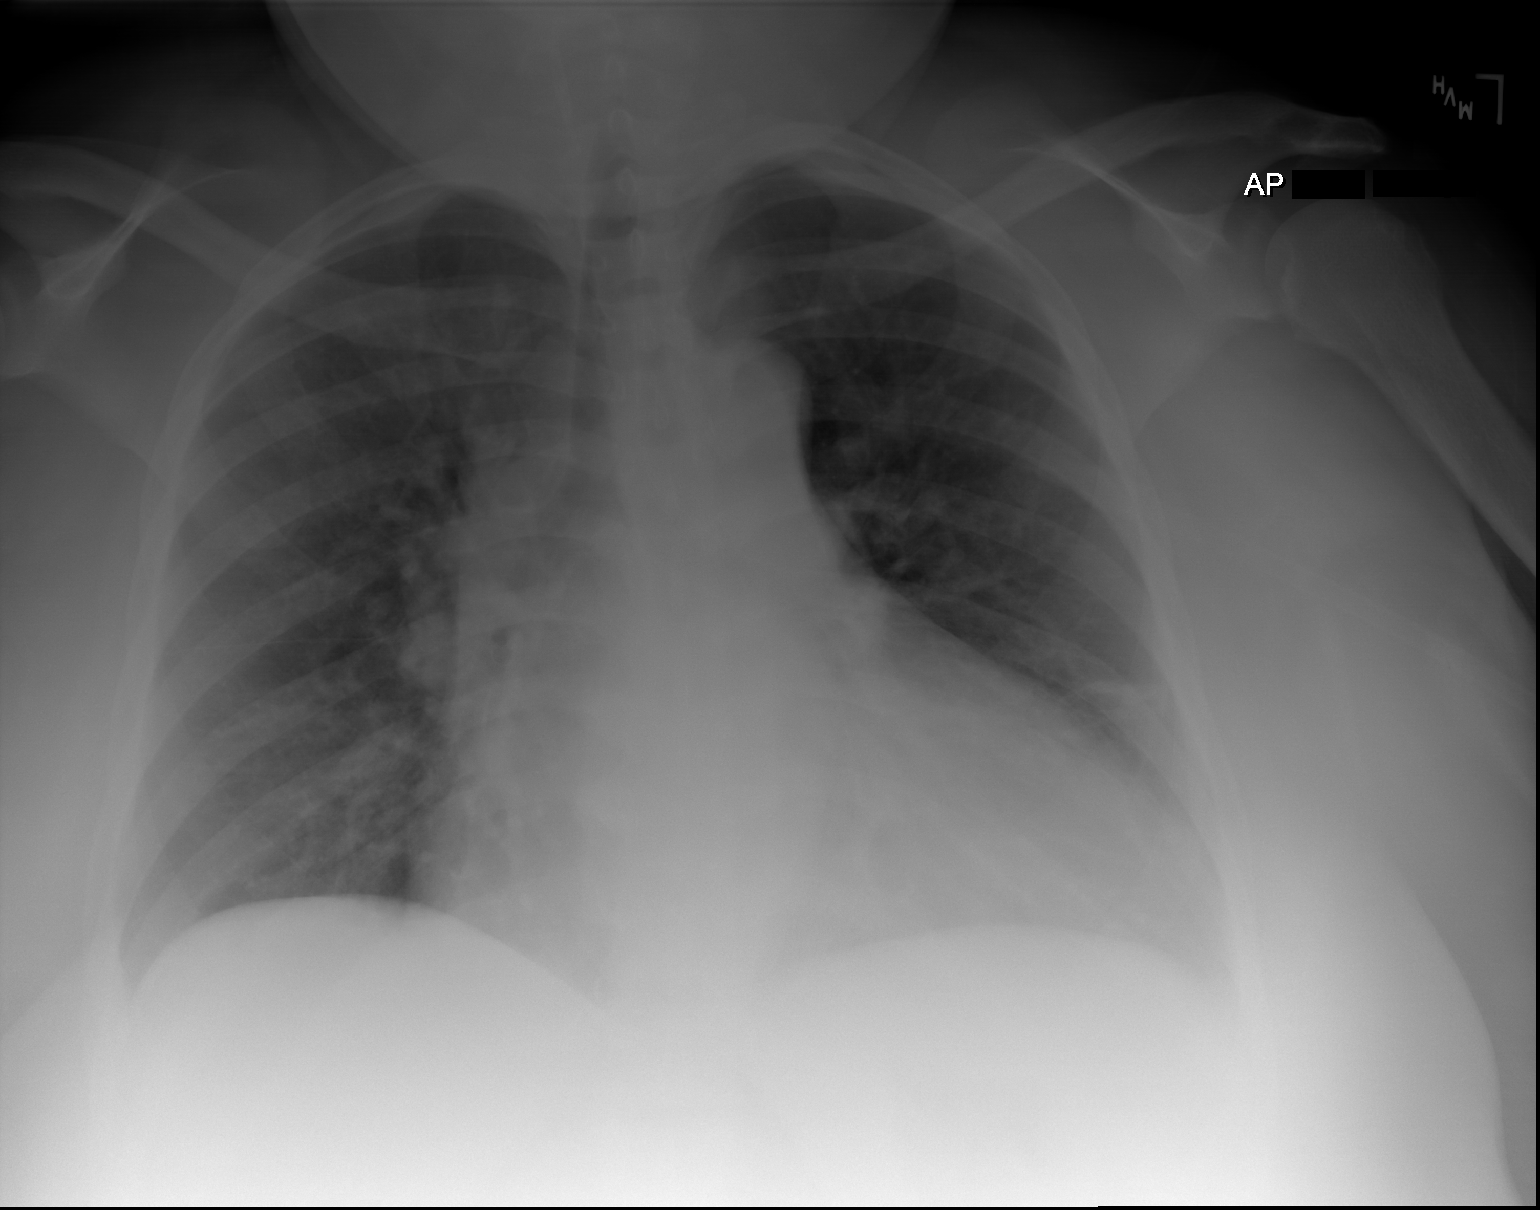

[1 of 1 positions shown; findings below may reference images not displayed]

FINDINGS: Lungs are clear save for minimal left basilar scarring. No
pneumothorax or pleural effusion. Mild cardiomegaly is stable when
accounting for changes in patient positioning. Pulmonary vascularity
is normal. No acute bone abnormality.
IMPRESSION: No active disease.  Stable cardiomegaly.

## 2022-04-04 IMAGING — CT CT HEAD W/O CM
3 series · 16 of 47 positions shown, 19 images · non-contrast
Comparison: None.

CLINICAL DATA: Delirium

EXAM:
CT HEAD WITHOUT CONTRAST
TECHNIQUE: Contiguous axial images were obtained from the base of the skull
through the vertex without intravenous contrast.

[Series 2: head wo · axial · 0.47mm/px · z∈[-109,+26]mm · 10 of 33 slices shown, 13 images]
[im 3/33  brain]
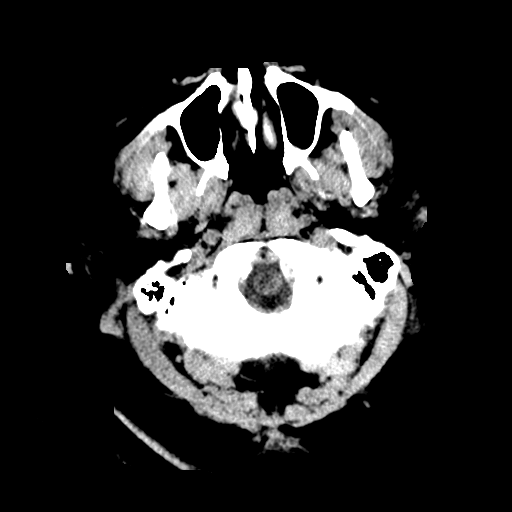
[im 3/33  bone]
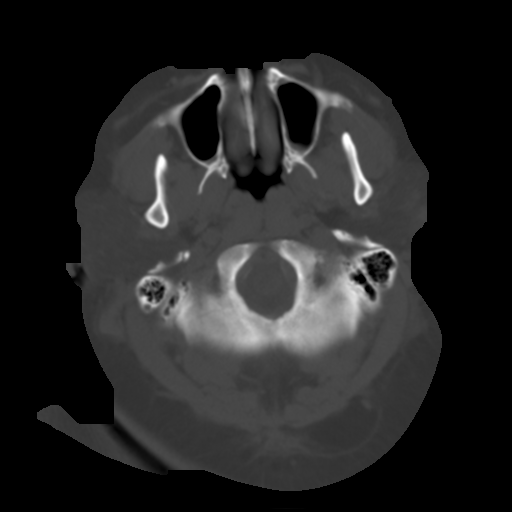
[im 6/33  brain]
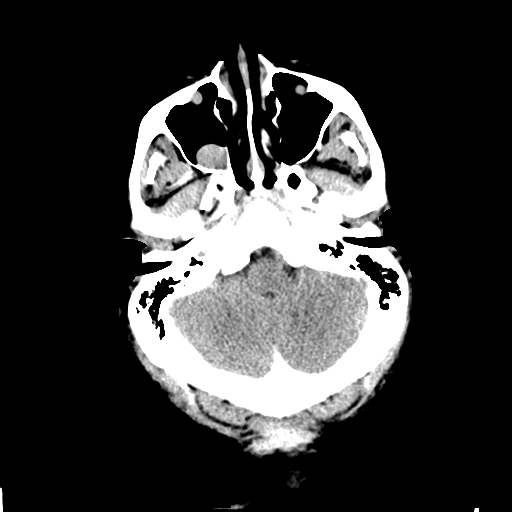
[im 9/33  brain]
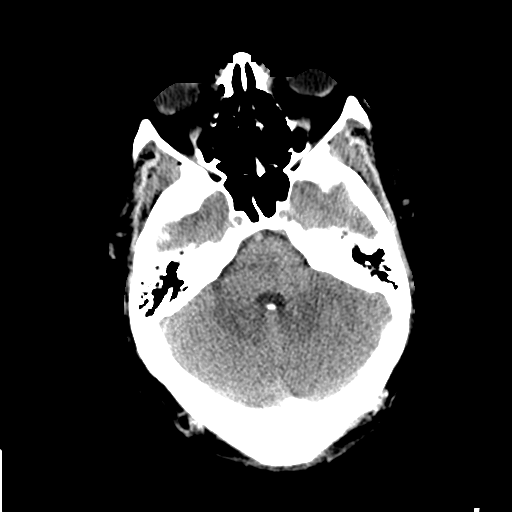
[im 12/33  brain]
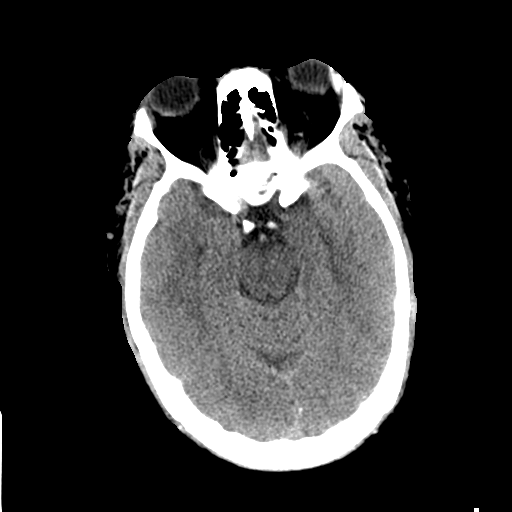
[im 15/33  brain]
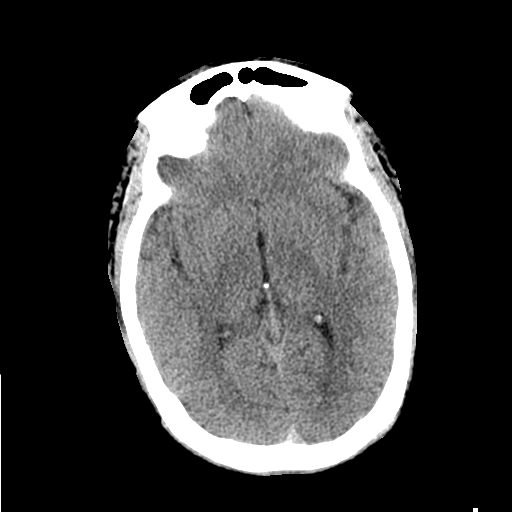
[im 15/33  bone]
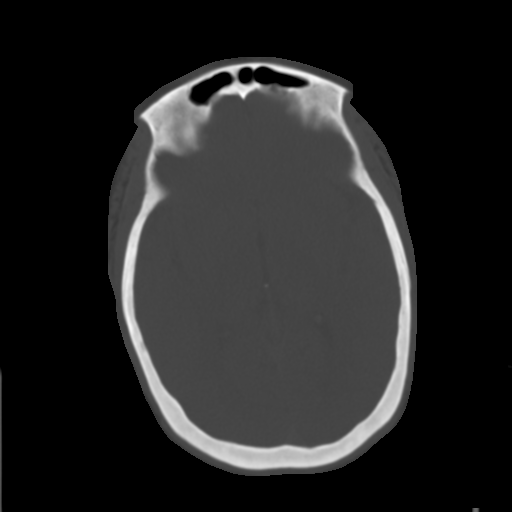
[im 18/33  brain]
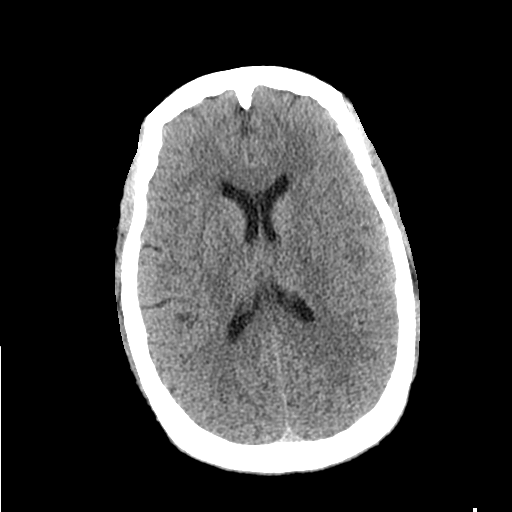
[im 21/33  brain]
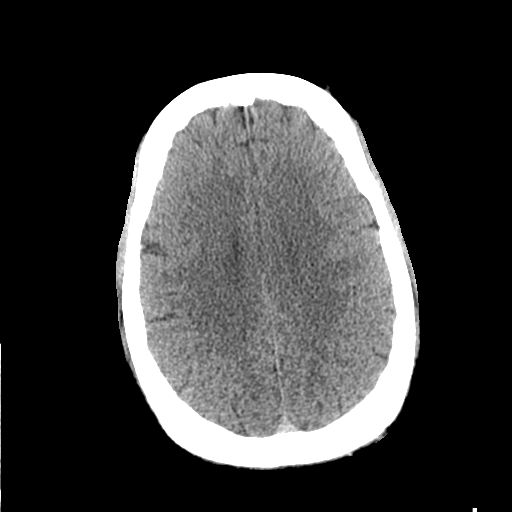
[im 25/33  brain]
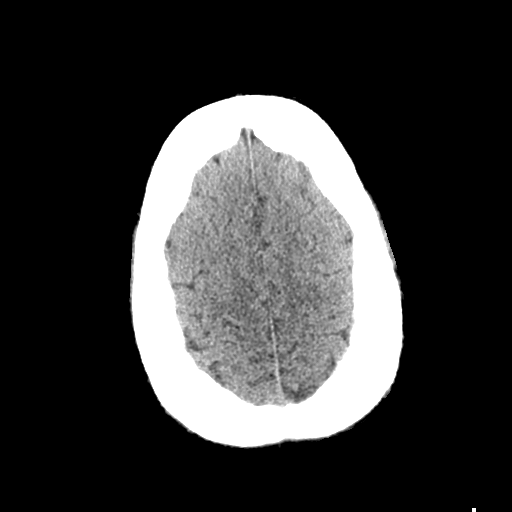
[im 27/33  brain]
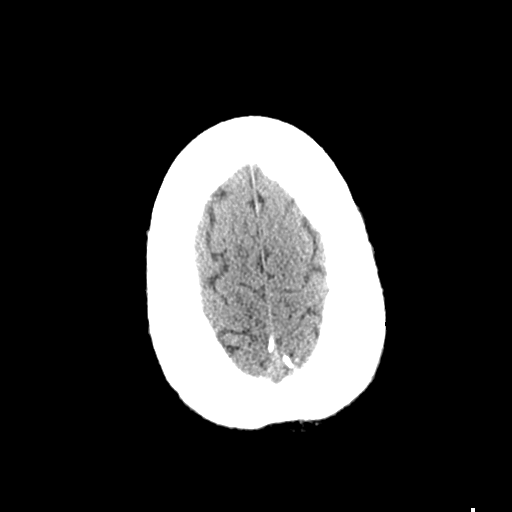
[im 27/33  bone]
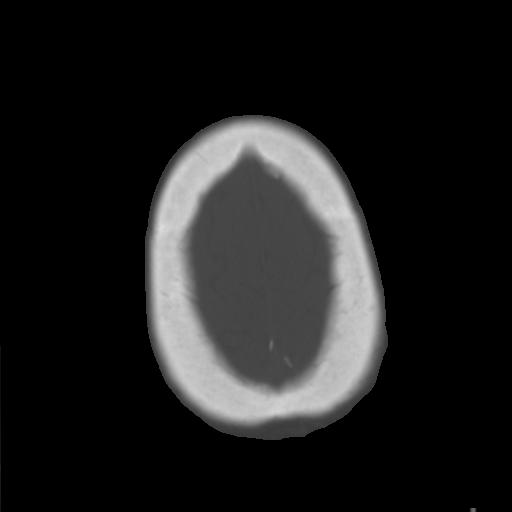
[im 30/33  brain]
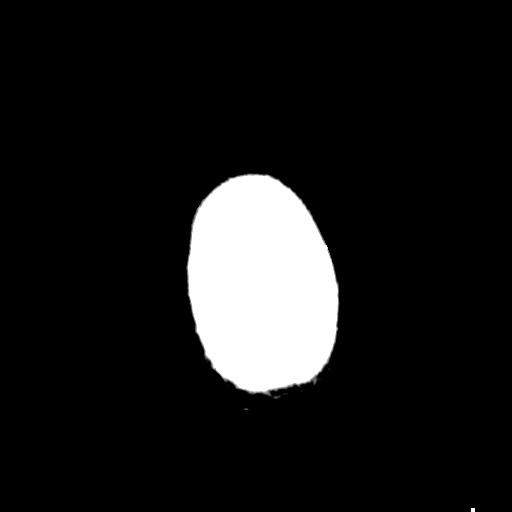

[Series 5: coronal soft tissue · coronal · 0.32mm/px · 3 of 71 slices shown]
[im 27/71  brain]
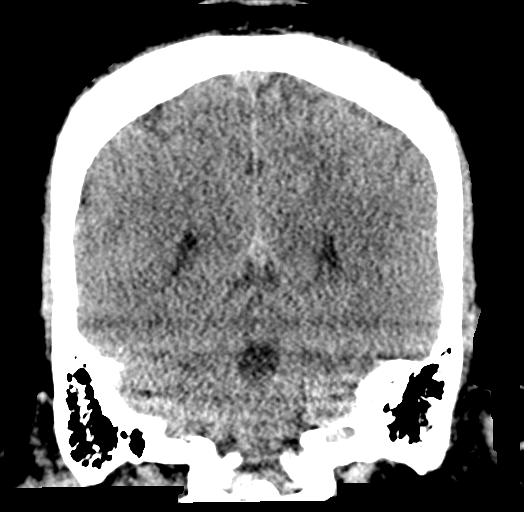
[im 33/71  brain]
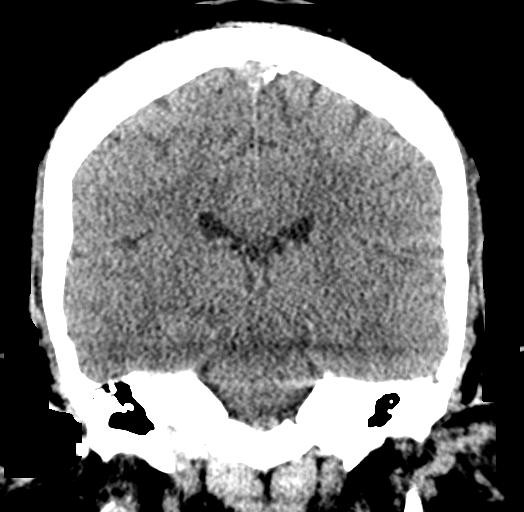
[im 39/71  brain]
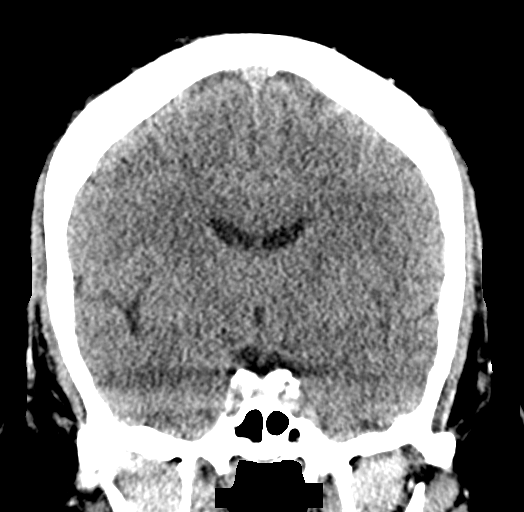

[Series 6: sagittal soft tissue · sagittal · 0.32mm/px · 3 of 57 slices shown]
[im 19/57  brain]
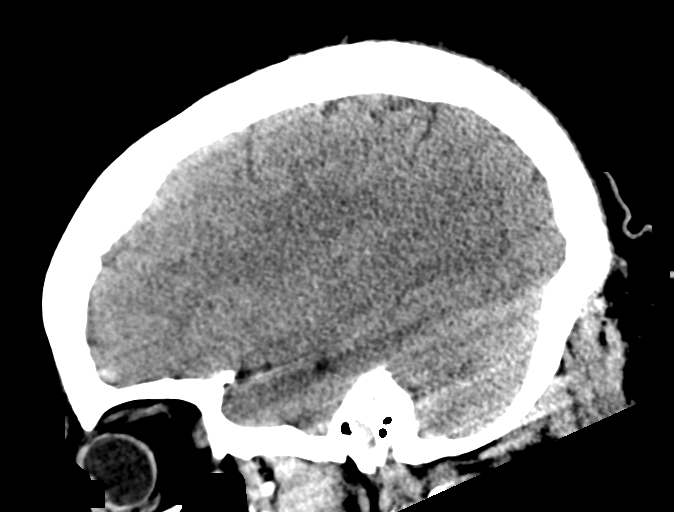
[im 29/57  brain]
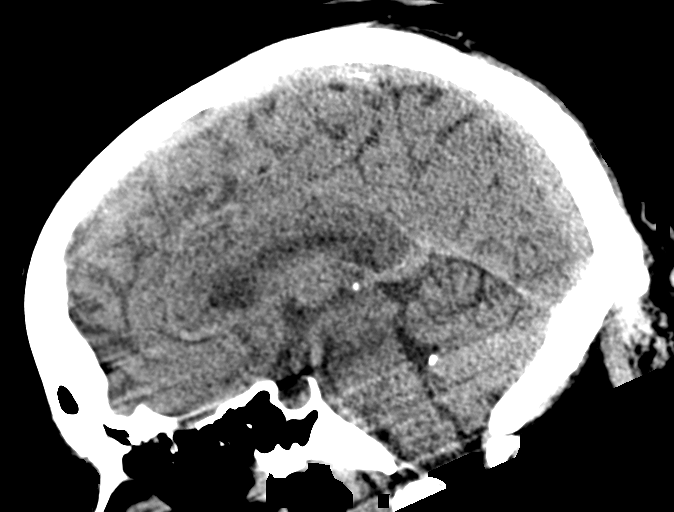
[im 38/57  brain]
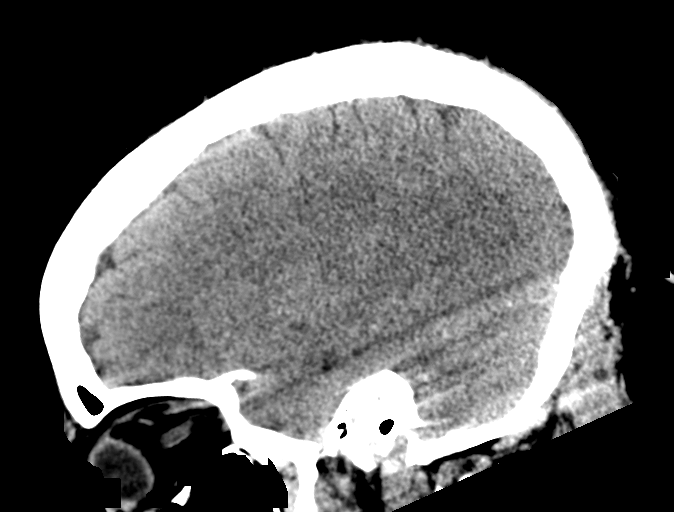

[16 of 47 positions shown; findings below may reference images not displayed]

FINDINGS: Brain: There is no acute intracranial hemorrhage, mass effect, or
edema. Gray-white differentiation is preserved. There is no
extra-axial fluid collection. Ventricles and sulci are within normal
limits in size and configuration.

Vascular: There is atherosclerotic calcification at the skull base.

Skull: Calvarium is unremarkable.

Sinuses/Orbits: Mild polypoid maxillary sinus mucosal thickening. No
acute abnormality of the orbits.

Other: None.
IMPRESSION: No acute intracranial abnormality.

## 2022-04-06 DIAGNOSIS — D509 Iron deficiency anemia, unspecified: Secondary | ICD-10-CM | POA: Diagnosis not present

## 2022-04-06 DIAGNOSIS — D631 Anemia in chronic kidney disease: Secondary | ICD-10-CM | POA: Diagnosis not present

## 2022-04-06 DIAGNOSIS — N2581 Secondary hyperparathyroidism of renal origin: Secondary | ICD-10-CM | POA: Diagnosis not present

## 2022-04-06 DIAGNOSIS — N186 End stage renal disease: Secondary | ICD-10-CM | POA: Diagnosis not present

## 2022-04-09 DIAGNOSIS — N2581 Secondary hyperparathyroidism of renal origin: Secondary | ICD-10-CM | POA: Diagnosis not present

## 2022-04-09 DIAGNOSIS — D509 Iron deficiency anemia, unspecified: Secondary | ICD-10-CM | POA: Diagnosis not present

## 2022-04-09 DIAGNOSIS — D631 Anemia in chronic kidney disease: Secondary | ICD-10-CM | POA: Diagnosis not present

## 2022-04-09 DIAGNOSIS — N186 End stage renal disease: Secondary | ICD-10-CM | POA: Diagnosis not present

## 2022-04-11 DIAGNOSIS — N2581 Secondary hyperparathyroidism of renal origin: Secondary | ICD-10-CM | POA: Diagnosis not present

## 2022-04-11 DIAGNOSIS — D509 Iron deficiency anemia, unspecified: Secondary | ICD-10-CM | POA: Diagnosis not present

## 2022-04-11 DIAGNOSIS — N186 End stage renal disease: Secondary | ICD-10-CM | POA: Diagnosis not present

## 2022-04-11 DIAGNOSIS — D631 Anemia in chronic kidney disease: Secondary | ICD-10-CM | POA: Diagnosis not present

## 2022-04-12 DIAGNOSIS — Z992 Dependence on renal dialysis: Secondary | ICD-10-CM | POA: Diagnosis not present

## 2022-04-12 DIAGNOSIS — N186 End stage renal disease: Secondary | ICD-10-CM | POA: Diagnosis not present

## 2022-04-13 DIAGNOSIS — N2581 Secondary hyperparathyroidism of renal origin: Secondary | ICD-10-CM | POA: Diagnosis not present

## 2022-04-13 DIAGNOSIS — D631 Anemia in chronic kidney disease: Secondary | ICD-10-CM | POA: Diagnosis not present

## 2022-04-13 DIAGNOSIS — D509 Iron deficiency anemia, unspecified: Secondary | ICD-10-CM | POA: Diagnosis not present

## 2022-04-13 DIAGNOSIS — N186 End stage renal disease: Secondary | ICD-10-CM | POA: Diagnosis not present

## 2022-04-16 DIAGNOSIS — N2581 Secondary hyperparathyroidism of renal origin: Secondary | ICD-10-CM | POA: Diagnosis not present

## 2022-04-16 DIAGNOSIS — D509 Iron deficiency anemia, unspecified: Secondary | ICD-10-CM | POA: Diagnosis not present

## 2022-04-16 DIAGNOSIS — D631 Anemia in chronic kidney disease: Secondary | ICD-10-CM | POA: Diagnosis not present

## 2022-04-16 DIAGNOSIS — N186 End stage renal disease: Secondary | ICD-10-CM | POA: Diagnosis not present

## 2022-04-18 DIAGNOSIS — E1122 Type 2 diabetes mellitus with diabetic chronic kidney disease: Secondary | ICD-10-CM | POA: Diagnosis not present

## 2022-04-18 DIAGNOSIS — N186 End stage renal disease: Secondary | ICD-10-CM | POA: Diagnosis not present

## 2022-04-18 DIAGNOSIS — N2581 Secondary hyperparathyroidism of renal origin: Secondary | ICD-10-CM | POA: Diagnosis not present

## 2022-04-18 DIAGNOSIS — D509 Iron deficiency anemia, unspecified: Secondary | ICD-10-CM | POA: Diagnosis not present

## 2022-04-18 DIAGNOSIS — D631 Anemia in chronic kidney disease: Secondary | ICD-10-CM | POA: Diagnosis not present

## 2022-04-20 DIAGNOSIS — D631 Anemia in chronic kidney disease: Secondary | ICD-10-CM | POA: Diagnosis not present

## 2022-04-20 DIAGNOSIS — N186 End stage renal disease: Secondary | ICD-10-CM | POA: Diagnosis not present

## 2022-04-20 DIAGNOSIS — N2581 Secondary hyperparathyroidism of renal origin: Secondary | ICD-10-CM | POA: Diagnosis not present

## 2022-04-20 DIAGNOSIS — D509 Iron deficiency anemia, unspecified: Secondary | ICD-10-CM | POA: Diagnosis not present

## 2022-04-23 DIAGNOSIS — N2581 Secondary hyperparathyroidism of renal origin: Secondary | ICD-10-CM | POA: Diagnosis not present

## 2022-04-23 DIAGNOSIS — N186 End stage renal disease: Secondary | ICD-10-CM | POA: Diagnosis not present

## 2022-04-23 DIAGNOSIS — D509 Iron deficiency anemia, unspecified: Secondary | ICD-10-CM | POA: Diagnosis not present

## 2022-04-23 DIAGNOSIS — D631 Anemia in chronic kidney disease: Secondary | ICD-10-CM | POA: Diagnosis not present

## 2022-04-24 DIAGNOSIS — N19 Unspecified kidney failure: Secondary | ICD-10-CM | POA: Diagnosis not present

## 2022-04-24 DIAGNOSIS — E1122 Type 2 diabetes mellitus with diabetic chronic kidney disease: Secondary | ICD-10-CM | POA: Diagnosis not present

## 2022-04-24 DIAGNOSIS — I10 Essential (primary) hypertension: Secondary | ICD-10-CM | POA: Diagnosis not present

## 2022-04-24 DIAGNOSIS — Z992 Dependence on renal dialysis: Secondary | ICD-10-CM | POA: Diagnosis not present

## 2022-04-25 DIAGNOSIS — N2581 Secondary hyperparathyroidism of renal origin: Secondary | ICD-10-CM | POA: Diagnosis not present

## 2022-04-25 DIAGNOSIS — D631 Anemia in chronic kidney disease: Secondary | ICD-10-CM | POA: Diagnosis not present

## 2022-04-25 DIAGNOSIS — N186 End stage renal disease: Secondary | ICD-10-CM | POA: Diagnosis not present

## 2022-04-25 DIAGNOSIS — D509 Iron deficiency anemia, unspecified: Secondary | ICD-10-CM | POA: Diagnosis not present

## 2022-04-27 DIAGNOSIS — N186 End stage renal disease: Secondary | ICD-10-CM | POA: Diagnosis not present

## 2022-04-27 DIAGNOSIS — N2581 Secondary hyperparathyroidism of renal origin: Secondary | ICD-10-CM | POA: Diagnosis not present

## 2022-04-27 DIAGNOSIS — Z992 Dependence on renal dialysis: Secondary | ICD-10-CM | POA: Diagnosis not present

## 2022-04-28 DIAGNOSIS — R42 Dizziness and giddiness: Secondary | ICD-10-CM | POA: Diagnosis not present

## 2022-04-28 DIAGNOSIS — I959 Hypotension, unspecified: Secondary | ICD-10-CM | POA: Diagnosis not present

## 2022-04-28 DIAGNOSIS — R55 Syncope and collapse: Secondary | ICD-10-CM | POA: Diagnosis not present

## 2022-04-28 DIAGNOSIS — Z992 Dependence on renal dialysis: Secondary | ICD-10-CM | POA: Diagnosis not present

## 2022-04-28 DIAGNOSIS — R197 Diarrhea, unspecified: Secondary | ICD-10-CM | POA: Diagnosis not present

## 2022-04-28 DIAGNOSIS — R531 Weakness: Secondary | ICD-10-CM | POA: Diagnosis not present

## 2022-04-30 DIAGNOSIS — N186 End stage renal disease: Secondary | ICD-10-CM | POA: Diagnosis not present

## 2022-04-30 DIAGNOSIS — N2581 Secondary hyperparathyroidism of renal origin: Secondary | ICD-10-CM | POA: Diagnosis not present

## 2022-04-30 DIAGNOSIS — D631 Anemia in chronic kidney disease: Secondary | ICD-10-CM | POA: Diagnosis not present

## 2022-04-30 DIAGNOSIS — D509 Iron deficiency anemia, unspecified: Secondary | ICD-10-CM | POA: Diagnosis not present

## 2022-05-02 DIAGNOSIS — N186 End stage renal disease: Secondary | ICD-10-CM | POA: Diagnosis not present

## 2022-05-02 DIAGNOSIS — N2581 Secondary hyperparathyroidism of renal origin: Secondary | ICD-10-CM | POA: Diagnosis not present

## 2022-05-02 DIAGNOSIS — D509 Iron deficiency anemia, unspecified: Secondary | ICD-10-CM | POA: Diagnosis not present

## 2022-05-02 DIAGNOSIS — D631 Anemia in chronic kidney disease: Secondary | ICD-10-CM | POA: Diagnosis not present

## 2022-05-04 DIAGNOSIS — N186 End stage renal disease: Secondary | ICD-10-CM | POA: Diagnosis not present

## 2022-05-04 DIAGNOSIS — D509 Iron deficiency anemia, unspecified: Secondary | ICD-10-CM | POA: Diagnosis not present

## 2022-05-04 DIAGNOSIS — D631 Anemia in chronic kidney disease: Secondary | ICD-10-CM | POA: Diagnosis not present

## 2022-05-04 DIAGNOSIS — N2581 Secondary hyperparathyroidism of renal origin: Secondary | ICD-10-CM | POA: Diagnosis not present

## 2022-05-07 DIAGNOSIS — D631 Anemia in chronic kidney disease: Secondary | ICD-10-CM | POA: Diagnosis not present

## 2022-05-07 DIAGNOSIS — N2581 Secondary hyperparathyroidism of renal origin: Secondary | ICD-10-CM | POA: Diagnosis not present

## 2022-05-07 DIAGNOSIS — D509 Iron deficiency anemia, unspecified: Secondary | ICD-10-CM | POA: Diagnosis not present

## 2022-05-07 DIAGNOSIS — N186 End stage renal disease: Secondary | ICD-10-CM | POA: Diagnosis not present

## 2022-05-09 DIAGNOSIS — D509 Iron deficiency anemia, unspecified: Secondary | ICD-10-CM | POA: Diagnosis not present

## 2022-05-09 DIAGNOSIS — N186 End stage renal disease: Secondary | ICD-10-CM | POA: Diagnosis not present

## 2022-05-09 DIAGNOSIS — N2581 Secondary hyperparathyroidism of renal origin: Secondary | ICD-10-CM | POA: Diagnosis not present

## 2022-05-09 DIAGNOSIS — D631 Anemia in chronic kidney disease: Secondary | ICD-10-CM | POA: Diagnosis not present

## 2022-05-10 DIAGNOSIS — N2581 Secondary hyperparathyroidism of renal origin: Secondary | ICD-10-CM | POA: Diagnosis not present

## 2022-05-10 DIAGNOSIS — D631 Anemia in chronic kidney disease: Secondary | ICD-10-CM | POA: Diagnosis not present

## 2022-05-10 DIAGNOSIS — N186 End stage renal disease: Secondary | ICD-10-CM | POA: Diagnosis not present

## 2022-05-10 DIAGNOSIS — D509 Iron deficiency anemia, unspecified: Secondary | ICD-10-CM | POA: Diagnosis not present

## 2022-05-13 DIAGNOSIS — N186 End stage renal disease: Secondary | ICD-10-CM | POA: Diagnosis not present

## 2022-05-13 DIAGNOSIS — Z992 Dependence on renal dialysis: Secondary | ICD-10-CM | POA: Diagnosis not present

## 2022-05-14 DIAGNOSIS — H4311 Vitreous hemorrhage, right eye: Secondary | ICD-10-CM | POA: Diagnosis not present

## 2022-05-14 DIAGNOSIS — D509 Iron deficiency anemia, unspecified: Secondary | ICD-10-CM | POA: Diagnosis not present

## 2022-05-14 DIAGNOSIS — N2581 Secondary hyperparathyroidism of renal origin: Secondary | ICD-10-CM | POA: Diagnosis not present

## 2022-05-14 DIAGNOSIS — E113511 Type 2 diabetes mellitus with proliferative diabetic retinopathy with macular edema, right eye: Secondary | ICD-10-CM | POA: Diagnosis not present

## 2022-05-14 DIAGNOSIS — Z992 Dependence on renal dialysis: Secondary | ICD-10-CM | POA: Diagnosis not present

## 2022-05-14 DIAGNOSIS — D631 Anemia in chronic kidney disease: Secondary | ICD-10-CM | POA: Diagnosis not present

## 2022-05-14 DIAGNOSIS — H43821 Vitreomacular adhesion, right eye: Secondary | ICD-10-CM | POA: Diagnosis not present

## 2022-05-14 DIAGNOSIS — H35371 Puckering of macula, right eye: Secondary | ICD-10-CM | POA: Diagnosis not present

## 2022-05-14 DIAGNOSIS — N186 End stage renal disease: Secondary | ICD-10-CM | POA: Diagnosis not present

## 2022-05-14 DIAGNOSIS — N250 Renal osteodystrophy: Secondary | ICD-10-CM | POA: Diagnosis not present

## 2022-05-16 DIAGNOSIS — N2581 Secondary hyperparathyroidism of renal origin: Secondary | ICD-10-CM | POA: Diagnosis not present

## 2022-05-16 DIAGNOSIS — D509 Iron deficiency anemia, unspecified: Secondary | ICD-10-CM | POA: Diagnosis not present

## 2022-05-16 DIAGNOSIS — D631 Anemia in chronic kidney disease: Secondary | ICD-10-CM | POA: Diagnosis not present

## 2022-05-16 DIAGNOSIS — N186 End stage renal disease: Secondary | ICD-10-CM | POA: Diagnosis not present

## 2022-05-16 DIAGNOSIS — Z992 Dependence on renal dialysis: Secondary | ICD-10-CM | POA: Diagnosis not present

## 2022-05-18 DIAGNOSIS — D631 Anemia in chronic kidney disease: Secondary | ICD-10-CM | POA: Diagnosis not present

## 2022-05-18 DIAGNOSIS — N2581 Secondary hyperparathyroidism of renal origin: Secondary | ICD-10-CM | POA: Diagnosis not present

## 2022-05-18 DIAGNOSIS — N186 End stage renal disease: Secondary | ICD-10-CM | POA: Diagnosis not present

## 2022-05-18 DIAGNOSIS — Z992 Dependence on renal dialysis: Secondary | ICD-10-CM | POA: Diagnosis not present

## 2022-05-18 DIAGNOSIS — D509 Iron deficiency anemia, unspecified: Secondary | ICD-10-CM | POA: Diagnosis not present

## 2022-05-21 DIAGNOSIS — N186 End stage renal disease: Secondary | ICD-10-CM | POA: Diagnosis not present

## 2022-05-21 DIAGNOSIS — N2581 Secondary hyperparathyroidism of renal origin: Secondary | ICD-10-CM | POA: Diagnosis not present

## 2022-05-21 DIAGNOSIS — D509 Iron deficiency anemia, unspecified: Secondary | ICD-10-CM | POA: Diagnosis not present

## 2022-05-21 DIAGNOSIS — D631 Anemia in chronic kidney disease: Secondary | ICD-10-CM | POA: Diagnosis not present

## 2022-05-21 DIAGNOSIS — Z992 Dependence on renal dialysis: Secondary | ICD-10-CM | POA: Diagnosis not present

## 2022-05-23 DIAGNOSIS — N186 End stage renal disease: Secondary | ICD-10-CM | POA: Diagnosis not present

## 2022-05-23 DIAGNOSIS — N2581 Secondary hyperparathyroidism of renal origin: Secondary | ICD-10-CM | POA: Diagnosis not present

## 2022-05-23 DIAGNOSIS — Z992 Dependence on renal dialysis: Secondary | ICD-10-CM | POA: Diagnosis not present

## 2022-05-23 DIAGNOSIS — D509 Iron deficiency anemia, unspecified: Secondary | ICD-10-CM | POA: Diagnosis not present

## 2022-05-23 DIAGNOSIS — D631 Anemia in chronic kidney disease: Secondary | ICD-10-CM | POA: Diagnosis not present

## 2022-05-28 DIAGNOSIS — Z992 Dependence on renal dialysis: Secondary | ICD-10-CM | POA: Diagnosis not present

## 2022-05-28 DIAGNOSIS — N2581 Secondary hyperparathyroidism of renal origin: Secondary | ICD-10-CM | POA: Diagnosis not present

## 2022-05-28 DIAGNOSIS — D631 Anemia in chronic kidney disease: Secondary | ICD-10-CM | POA: Diagnosis not present

## 2022-05-28 DIAGNOSIS — D509 Iron deficiency anemia, unspecified: Secondary | ICD-10-CM | POA: Diagnosis not present

## 2022-05-28 DIAGNOSIS — N186 End stage renal disease: Secondary | ICD-10-CM | POA: Diagnosis not present

## 2022-05-30 DIAGNOSIS — E113531 Type 2 diabetes mellitus with proliferative diabetic retinopathy with traction retinal detachment not involving the macula, right eye: Secondary | ICD-10-CM | POA: Diagnosis not present

## 2022-05-30 DIAGNOSIS — H4311 Vitreous hemorrhage, right eye: Secondary | ICD-10-CM | POA: Diagnosis not present

## 2022-05-30 DIAGNOSIS — H35371 Puckering of macula, right eye: Secondary | ICD-10-CM | POA: Diagnosis not present

## 2022-05-30 DIAGNOSIS — E113541 Type 2 diabetes mellitus with proliferative diabetic retinopathy with combined traction retinal detachment and rhegmatogenous retinal detachment, right eye: Secondary | ICD-10-CM | POA: Diagnosis not present

## 2022-05-31 DIAGNOSIS — N186 End stage renal disease: Secondary | ICD-10-CM | POA: Diagnosis not present

## 2022-05-31 DIAGNOSIS — D509 Iron deficiency anemia, unspecified: Secondary | ICD-10-CM | POA: Diagnosis not present

## 2022-05-31 DIAGNOSIS — Z992 Dependence on renal dialysis: Secondary | ICD-10-CM | POA: Diagnosis not present

## 2022-05-31 DIAGNOSIS — H31091 Other chorioretinal scars, right eye: Secondary | ICD-10-CM | POA: Diagnosis not present

## 2022-05-31 DIAGNOSIS — D631 Anemia in chronic kidney disease: Secondary | ICD-10-CM | POA: Diagnosis not present

## 2022-05-31 DIAGNOSIS — N2581 Secondary hyperparathyroidism of renal origin: Secondary | ICD-10-CM | POA: Diagnosis not present

## 2022-06-03 DIAGNOSIS — N2581 Secondary hyperparathyroidism of renal origin: Secondary | ICD-10-CM | POA: Diagnosis not present

## 2022-06-03 DIAGNOSIS — N186 End stage renal disease: Secondary | ICD-10-CM | POA: Diagnosis not present

## 2022-06-03 DIAGNOSIS — Z992 Dependence on renal dialysis: Secondary | ICD-10-CM | POA: Diagnosis not present

## 2022-06-03 DIAGNOSIS — D631 Anemia in chronic kidney disease: Secondary | ICD-10-CM | POA: Diagnosis not present

## 2022-06-03 DIAGNOSIS — D509 Iron deficiency anemia, unspecified: Secondary | ICD-10-CM | POA: Diagnosis not present

## 2022-06-05 DIAGNOSIS — D509 Iron deficiency anemia, unspecified: Secondary | ICD-10-CM | POA: Diagnosis not present

## 2022-06-05 DIAGNOSIS — Z992 Dependence on renal dialysis: Secondary | ICD-10-CM | POA: Diagnosis not present

## 2022-06-05 DIAGNOSIS — D631 Anemia in chronic kidney disease: Secondary | ICD-10-CM | POA: Diagnosis not present

## 2022-06-05 DIAGNOSIS — N2581 Secondary hyperparathyroidism of renal origin: Secondary | ICD-10-CM | POA: Diagnosis not present

## 2022-06-05 DIAGNOSIS — N186 End stage renal disease: Secondary | ICD-10-CM | POA: Diagnosis not present

## 2022-06-07 DIAGNOSIS — Z992 Dependence on renal dialysis: Secondary | ICD-10-CM | POA: Diagnosis not present

## 2022-06-07 DIAGNOSIS — D509 Iron deficiency anemia, unspecified: Secondary | ICD-10-CM | POA: Diagnosis not present

## 2022-06-07 DIAGNOSIS — D631 Anemia in chronic kidney disease: Secondary | ICD-10-CM | POA: Diagnosis not present

## 2022-06-07 DIAGNOSIS — N2581 Secondary hyperparathyroidism of renal origin: Secondary | ICD-10-CM | POA: Diagnosis not present

## 2022-06-07 DIAGNOSIS — N186 End stage renal disease: Secondary | ICD-10-CM | POA: Diagnosis not present

## 2022-06-07 DIAGNOSIS — T85398A Other mechanical complication of other ocular prosthetic devices, implants and grafts, initial encounter: Secondary | ICD-10-CM | POA: Diagnosis not present

## 2022-06-07 DIAGNOSIS — E113511 Type 2 diabetes mellitus with proliferative diabetic retinopathy with macular edema, right eye: Secondary | ICD-10-CM | POA: Diagnosis not present

## 2022-06-10 DIAGNOSIS — Z992 Dependence on renal dialysis: Secondary | ICD-10-CM | POA: Diagnosis not present

## 2022-06-10 DIAGNOSIS — D509 Iron deficiency anemia, unspecified: Secondary | ICD-10-CM | POA: Diagnosis not present

## 2022-06-10 DIAGNOSIS — N2581 Secondary hyperparathyroidism of renal origin: Secondary | ICD-10-CM | POA: Diagnosis not present

## 2022-06-10 DIAGNOSIS — D631 Anemia in chronic kidney disease: Secondary | ICD-10-CM | POA: Diagnosis not present

## 2022-06-10 DIAGNOSIS — N186 End stage renal disease: Secondary | ICD-10-CM | POA: Diagnosis not present

## 2022-06-12 DIAGNOSIS — N186 End stage renal disease: Secondary | ICD-10-CM | POA: Diagnosis not present

## 2022-06-12 DIAGNOSIS — N2581 Secondary hyperparathyroidism of renal origin: Secondary | ICD-10-CM | POA: Diagnosis not present

## 2022-06-12 DIAGNOSIS — D509 Iron deficiency anemia, unspecified: Secondary | ICD-10-CM | POA: Diagnosis not present

## 2022-06-12 DIAGNOSIS — D631 Anemia in chronic kidney disease: Secondary | ICD-10-CM | POA: Diagnosis not present

## 2022-06-12 DIAGNOSIS — Z992 Dependence on renal dialysis: Secondary | ICD-10-CM | POA: Diagnosis not present

## 2022-06-13 DIAGNOSIS — N186 End stage renal disease: Secondary | ICD-10-CM | POA: Diagnosis not present

## 2022-06-14 DIAGNOSIS — T85398D Other mechanical complication of other ocular prosthetic devices, implants and grafts, subsequent encounter: Secondary | ICD-10-CM | POA: Diagnosis not present

## 2022-06-14 DIAGNOSIS — D509 Iron deficiency anemia, unspecified: Secondary | ICD-10-CM | POA: Diagnosis not present

## 2022-06-14 DIAGNOSIS — N2581 Secondary hyperparathyroidism of renal origin: Secondary | ICD-10-CM | POA: Diagnosis not present

## 2022-06-14 DIAGNOSIS — D631 Anemia in chronic kidney disease: Secondary | ICD-10-CM | POA: Diagnosis not present

## 2022-06-14 DIAGNOSIS — N186 End stage renal disease: Secondary | ICD-10-CM | POA: Diagnosis not present

## 2022-06-14 DIAGNOSIS — Z992 Dependence on renal dialysis: Secondary | ICD-10-CM | POA: Diagnosis not present

## 2022-06-14 DIAGNOSIS — H31091 Other chorioretinal scars, right eye: Secondary | ICD-10-CM | POA: Diagnosis not present

## 2022-06-14 DIAGNOSIS — E113511 Type 2 diabetes mellitus with proliferative diabetic retinopathy with macular edema, right eye: Secondary | ICD-10-CM | POA: Diagnosis not present

## 2022-06-17 DIAGNOSIS — D631 Anemia in chronic kidney disease: Secondary | ICD-10-CM | POA: Diagnosis not present

## 2022-06-17 DIAGNOSIS — N2581 Secondary hyperparathyroidism of renal origin: Secondary | ICD-10-CM | POA: Diagnosis not present

## 2022-06-17 DIAGNOSIS — Z992 Dependence on renal dialysis: Secondary | ICD-10-CM | POA: Diagnosis not present

## 2022-06-17 DIAGNOSIS — N186 End stage renal disease: Secondary | ICD-10-CM | POA: Diagnosis not present

## 2022-06-17 DIAGNOSIS — D509 Iron deficiency anemia, unspecified: Secondary | ICD-10-CM | POA: Diagnosis not present

## 2022-06-19 DIAGNOSIS — Z992 Dependence on renal dialysis: Secondary | ICD-10-CM | POA: Diagnosis not present

## 2022-06-19 DIAGNOSIS — G4733 Obstructive sleep apnea (adult) (pediatric): Secondary | ICD-10-CM | POA: Diagnosis not present

## 2022-06-19 DIAGNOSIS — D631 Anemia in chronic kidney disease: Secondary | ICD-10-CM | POA: Diagnosis not present

## 2022-06-19 DIAGNOSIS — D509 Iron deficiency anemia, unspecified: Secondary | ICD-10-CM | POA: Diagnosis not present

## 2022-06-19 DIAGNOSIS — N250 Renal osteodystrophy: Secondary | ICD-10-CM | POA: Diagnosis not present

## 2022-06-19 DIAGNOSIS — I10 Essential (primary) hypertension: Secondary | ICD-10-CM | POA: Diagnosis not present

## 2022-06-19 DIAGNOSIS — N2581 Secondary hyperparathyroidism of renal origin: Secondary | ICD-10-CM | POA: Diagnosis not present

## 2022-06-19 DIAGNOSIS — N186 End stage renal disease: Secondary | ICD-10-CM | POA: Diagnosis not present

## 2022-06-20 DIAGNOSIS — I120 Hypertensive chronic kidney disease with stage 5 chronic kidney disease or end stage renal disease: Secondary | ICD-10-CM | POA: Diagnosis not present

## 2022-06-20 DIAGNOSIS — Z794 Long term (current) use of insulin: Secondary | ICD-10-CM | POA: Diagnosis not present

## 2022-06-20 DIAGNOSIS — M8580 Other specified disorders of bone density and structure, unspecified site: Secondary | ICD-10-CM | POA: Diagnosis not present

## 2022-06-20 DIAGNOSIS — E1122 Type 2 diabetes mellitus with diabetic chronic kidney disease: Secondary | ICD-10-CM | POA: Diagnosis not present

## 2022-06-20 DIAGNOSIS — N186 End stage renal disease: Secondary | ICD-10-CM | POA: Diagnosis not present

## 2022-06-20 DIAGNOSIS — Z992 Dependence on renal dialysis: Secondary | ICD-10-CM | POA: Diagnosis not present

## 2022-06-20 DIAGNOSIS — Z9181 History of falling: Secondary | ICD-10-CM | POA: Diagnosis not present

## 2022-06-20 DIAGNOSIS — D631 Anemia in chronic kidney disease: Secondary | ICD-10-CM | POA: Diagnosis not present

## 2022-06-21 DIAGNOSIS — D631 Anemia in chronic kidney disease: Secondary | ICD-10-CM | POA: Diagnosis not present

## 2022-06-21 DIAGNOSIS — D509 Iron deficiency anemia, unspecified: Secondary | ICD-10-CM | POA: Diagnosis not present

## 2022-06-21 DIAGNOSIS — Z992 Dependence on renal dialysis: Secondary | ICD-10-CM | POA: Diagnosis not present

## 2022-06-21 DIAGNOSIS — N186 End stage renal disease: Secondary | ICD-10-CM | POA: Diagnosis not present

## 2022-06-21 DIAGNOSIS — N2581 Secondary hyperparathyroidism of renal origin: Secondary | ICD-10-CM | POA: Diagnosis not present

## 2022-06-24 DIAGNOSIS — D631 Anemia in chronic kidney disease: Secondary | ICD-10-CM | POA: Diagnosis not present

## 2022-06-24 DIAGNOSIS — D509 Iron deficiency anemia, unspecified: Secondary | ICD-10-CM | POA: Diagnosis not present

## 2022-06-24 DIAGNOSIS — Z992 Dependence on renal dialysis: Secondary | ICD-10-CM | POA: Diagnosis not present

## 2022-06-24 DIAGNOSIS — N2581 Secondary hyperparathyroidism of renal origin: Secondary | ICD-10-CM | POA: Diagnosis not present

## 2022-06-24 DIAGNOSIS — N186 End stage renal disease: Secondary | ICD-10-CM | POA: Diagnosis not present

## 2022-06-26 DIAGNOSIS — D509 Iron deficiency anemia, unspecified: Secondary | ICD-10-CM | POA: Diagnosis not present

## 2022-06-26 DIAGNOSIS — N186 End stage renal disease: Secondary | ICD-10-CM | POA: Diagnosis not present

## 2022-06-26 DIAGNOSIS — N2581 Secondary hyperparathyroidism of renal origin: Secondary | ICD-10-CM | POA: Diagnosis not present

## 2022-06-26 DIAGNOSIS — Z992 Dependence on renal dialysis: Secondary | ICD-10-CM | POA: Diagnosis not present

## 2022-06-26 DIAGNOSIS — D631 Anemia in chronic kidney disease: Secondary | ICD-10-CM | POA: Diagnosis not present

## 2022-06-28 DIAGNOSIS — N186 End stage renal disease: Secondary | ICD-10-CM | POA: Diagnosis not present

## 2022-06-28 DIAGNOSIS — D631 Anemia in chronic kidney disease: Secondary | ICD-10-CM | POA: Diagnosis not present

## 2022-06-28 DIAGNOSIS — N2581 Secondary hyperparathyroidism of renal origin: Secondary | ICD-10-CM | POA: Diagnosis not present

## 2022-06-28 DIAGNOSIS — D509 Iron deficiency anemia, unspecified: Secondary | ICD-10-CM | POA: Diagnosis not present

## 2022-06-28 DIAGNOSIS — Z992 Dependence on renal dialysis: Secondary | ICD-10-CM | POA: Diagnosis not present

## 2022-07-01 DIAGNOSIS — D509 Iron deficiency anemia, unspecified: Secondary | ICD-10-CM | POA: Diagnosis not present

## 2022-07-01 DIAGNOSIS — N186 End stage renal disease: Secondary | ICD-10-CM | POA: Diagnosis not present

## 2022-07-01 DIAGNOSIS — Z992 Dependence on renal dialysis: Secondary | ICD-10-CM | POA: Diagnosis not present

## 2022-07-03 DIAGNOSIS — N2581 Secondary hyperparathyroidism of renal origin: Secondary | ICD-10-CM | POA: Diagnosis not present

## 2022-07-03 DIAGNOSIS — N186 End stage renal disease: Secondary | ICD-10-CM | POA: Diagnosis not present

## 2022-07-03 DIAGNOSIS — D509 Iron deficiency anemia, unspecified: Secondary | ICD-10-CM | POA: Diagnosis not present

## 2022-07-03 DIAGNOSIS — D631 Anemia in chronic kidney disease: Secondary | ICD-10-CM | POA: Diagnosis not present

## 2022-07-03 DIAGNOSIS — Z992 Dependence on renal dialysis: Secondary | ICD-10-CM | POA: Diagnosis not present

## 2022-07-05 DIAGNOSIS — D631 Anemia in chronic kidney disease: Secondary | ICD-10-CM | POA: Diagnosis not present

## 2022-07-05 DIAGNOSIS — Z992 Dependence on renal dialysis: Secondary | ICD-10-CM | POA: Diagnosis not present

## 2022-07-05 DIAGNOSIS — D509 Iron deficiency anemia, unspecified: Secondary | ICD-10-CM | POA: Diagnosis not present

## 2022-07-05 DIAGNOSIS — N2581 Secondary hyperparathyroidism of renal origin: Secondary | ICD-10-CM | POA: Diagnosis not present

## 2022-07-05 DIAGNOSIS — N186 End stage renal disease: Secondary | ICD-10-CM | POA: Diagnosis not present

## 2022-07-08 DIAGNOSIS — Z992 Dependence on renal dialysis: Secondary | ICD-10-CM | POA: Diagnosis not present

## 2022-07-08 DIAGNOSIS — N186 End stage renal disease: Secondary | ICD-10-CM | POA: Diagnosis not present

## 2022-07-08 DIAGNOSIS — N2581 Secondary hyperparathyroidism of renal origin: Secondary | ICD-10-CM | POA: Diagnosis not present

## 2022-07-08 DIAGNOSIS — D509 Iron deficiency anemia, unspecified: Secondary | ICD-10-CM | POA: Diagnosis not present

## 2022-07-08 DIAGNOSIS — D631 Anemia in chronic kidney disease: Secondary | ICD-10-CM | POA: Diagnosis not present

## 2022-07-09 DIAGNOSIS — T85398D Other mechanical complication of other ocular prosthetic devices, implants and grafts, subsequent encounter: Secondary | ICD-10-CM | POA: Diagnosis not present

## 2022-07-09 DIAGNOSIS — E113591 Type 2 diabetes mellitus with proliferative diabetic retinopathy without macular edema, right eye: Secondary | ICD-10-CM | POA: Diagnosis not present

## 2022-07-10 DIAGNOSIS — N186 End stage renal disease: Secondary | ICD-10-CM | POA: Diagnosis not present

## 2022-07-10 DIAGNOSIS — N2581 Secondary hyperparathyroidism of renal origin: Secondary | ICD-10-CM | POA: Diagnosis not present

## 2022-07-10 DIAGNOSIS — D631 Anemia in chronic kidney disease: Secondary | ICD-10-CM | POA: Diagnosis not present

## 2022-07-10 DIAGNOSIS — D509 Iron deficiency anemia, unspecified: Secondary | ICD-10-CM | POA: Diagnosis not present

## 2022-07-10 DIAGNOSIS — Z992 Dependence on renal dialysis: Secondary | ICD-10-CM | POA: Diagnosis not present

## 2022-07-12 DIAGNOSIS — D509 Iron deficiency anemia, unspecified: Secondary | ICD-10-CM | POA: Diagnosis not present

## 2022-07-12 DIAGNOSIS — N2581 Secondary hyperparathyroidism of renal origin: Secondary | ICD-10-CM | POA: Diagnosis not present

## 2022-07-12 DIAGNOSIS — Z992 Dependence on renal dialysis: Secondary | ICD-10-CM | POA: Diagnosis not present

## 2022-07-12 DIAGNOSIS — D631 Anemia in chronic kidney disease: Secondary | ICD-10-CM | POA: Diagnosis not present

## 2022-07-12 DIAGNOSIS — N186 End stage renal disease: Secondary | ICD-10-CM | POA: Diagnosis not present

## 2022-07-13 DIAGNOSIS — N186 End stage renal disease: Secondary | ICD-10-CM | POA: Diagnosis not present

## 2022-07-15 DIAGNOSIS — N186 End stage renal disease: Secondary | ICD-10-CM | POA: Diagnosis not present

## 2022-07-15 DIAGNOSIS — Z23 Encounter for immunization: Secondary | ICD-10-CM | POA: Diagnosis not present

## 2022-07-15 DIAGNOSIS — N250 Renal osteodystrophy: Secondary | ICD-10-CM | POA: Diagnosis not present

## 2022-07-15 DIAGNOSIS — Z992 Dependence on renal dialysis: Secondary | ICD-10-CM | POA: Diagnosis not present

## 2022-07-15 DIAGNOSIS — D631 Anemia in chronic kidney disease: Secondary | ICD-10-CM | POA: Diagnosis not present

## 2022-07-15 DIAGNOSIS — N2581 Secondary hyperparathyroidism of renal origin: Secondary | ICD-10-CM | POA: Diagnosis not present

## 2022-07-15 DIAGNOSIS — D509 Iron deficiency anemia, unspecified: Secondary | ICD-10-CM | POA: Diagnosis not present

## 2022-07-17 DIAGNOSIS — N2581 Secondary hyperparathyroidism of renal origin: Secondary | ICD-10-CM | POA: Diagnosis not present

## 2022-07-17 DIAGNOSIS — Z23 Encounter for immunization: Secondary | ICD-10-CM | POA: Diagnosis not present

## 2022-07-17 DIAGNOSIS — D631 Anemia in chronic kidney disease: Secondary | ICD-10-CM | POA: Diagnosis not present

## 2022-07-17 DIAGNOSIS — D509 Iron deficiency anemia, unspecified: Secondary | ICD-10-CM | POA: Diagnosis not present

## 2022-07-17 DIAGNOSIS — Z992 Dependence on renal dialysis: Secondary | ICD-10-CM | POA: Diagnosis not present

## 2022-07-17 DIAGNOSIS — N186 End stage renal disease: Secondary | ICD-10-CM | POA: Diagnosis not present

## 2022-07-19 DIAGNOSIS — D631 Anemia in chronic kidney disease: Secondary | ICD-10-CM | POA: Diagnosis not present

## 2022-07-19 DIAGNOSIS — G4733 Obstructive sleep apnea (adult) (pediatric): Secondary | ICD-10-CM | POA: Diagnosis not present

## 2022-07-19 DIAGNOSIS — Z23 Encounter for immunization: Secondary | ICD-10-CM | POA: Diagnosis not present

## 2022-07-19 DIAGNOSIS — N186 End stage renal disease: Secondary | ICD-10-CM | POA: Diagnosis not present

## 2022-07-19 DIAGNOSIS — D509 Iron deficiency anemia, unspecified: Secondary | ICD-10-CM | POA: Diagnosis not present

## 2022-07-19 DIAGNOSIS — I10 Essential (primary) hypertension: Secondary | ICD-10-CM | POA: Diagnosis not present

## 2022-07-19 DIAGNOSIS — Z992 Dependence on renal dialysis: Secondary | ICD-10-CM | POA: Diagnosis not present

## 2022-07-19 DIAGNOSIS — N2581 Secondary hyperparathyroidism of renal origin: Secondary | ICD-10-CM | POA: Diagnosis not present

## 2022-07-20 DIAGNOSIS — I120 Hypertensive chronic kidney disease with stage 5 chronic kidney disease or end stage renal disease: Secondary | ICD-10-CM | POA: Diagnosis not present

## 2022-07-20 DIAGNOSIS — Z794 Long term (current) use of insulin: Secondary | ICD-10-CM | POA: Diagnosis not present

## 2022-07-20 DIAGNOSIS — M8580 Other specified disorders of bone density and structure, unspecified site: Secondary | ICD-10-CM | POA: Diagnosis not present

## 2022-07-20 DIAGNOSIS — N186 End stage renal disease: Secondary | ICD-10-CM | POA: Diagnosis not present

## 2022-07-20 DIAGNOSIS — D631 Anemia in chronic kidney disease: Secondary | ICD-10-CM | POA: Diagnosis not present

## 2022-07-20 DIAGNOSIS — Z9181 History of falling: Secondary | ICD-10-CM | POA: Diagnosis not present

## 2022-07-20 DIAGNOSIS — Z992 Dependence on renal dialysis: Secondary | ICD-10-CM | POA: Diagnosis not present

## 2022-07-20 DIAGNOSIS — E1122 Type 2 diabetes mellitus with diabetic chronic kidney disease: Secondary | ICD-10-CM | POA: Diagnosis not present

## 2022-07-22 DIAGNOSIS — R58 Hemorrhage, not elsewhere classified: Secondary | ICD-10-CM | POA: Diagnosis not present

## 2022-07-22 DIAGNOSIS — R42 Dizziness and giddiness: Secondary | ICD-10-CM | POA: Diagnosis not present

## 2022-07-22 DIAGNOSIS — D509 Iron deficiency anemia, unspecified: Secondary | ICD-10-CM | POA: Diagnosis not present

## 2022-07-22 DIAGNOSIS — Z992 Dependence on renal dialysis: Secondary | ICD-10-CM | POA: Diagnosis not present

## 2022-07-22 DIAGNOSIS — N186 End stage renal disease: Secondary | ICD-10-CM | POA: Diagnosis not present

## 2022-07-22 DIAGNOSIS — N2581 Secondary hyperparathyroidism of renal origin: Secondary | ICD-10-CM | POA: Diagnosis not present

## 2022-07-22 DIAGNOSIS — Z23 Encounter for immunization: Secondary | ICD-10-CM | POA: Diagnosis not present

## 2022-07-22 DIAGNOSIS — D631 Anemia in chronic kidney disease: Secondary | ICD-10-CM | POA: Diagnosis not present

## 2022-07-22 DIAGNOSIS — R531 Weakness: Secondary | ICD-10-CM | POA: Diagnosis not present

## 2022-07-26 DIAGNOSIS — Z888 Allergy status to other drugs, medicaments and biological substances status: Secondary | ICD-10-CM | POA: Diagnosis not present

## 2022-07-26 DIAGNOSIS — N2581 Secondary hyperparathyroidism of renal origin: Secondary | ICD-10-CM | POA: Diagnosis not present

## 2022-07-26 DIAGNOSIS — E1122 Type 2 diabetes mellitus with diabetic chronic kidney disease: Secondary | ICD-10-CM | POA: Diagnosis not present

## 2022-07-26 DIAGNOSIS — R0600 Dyspnea, unspecified: Secondary | ICD-10-CM | POA: Diagnosis not present

## 2022-07-26 DIAGNOSIS — I10 Essential (primary) hypertension: Secondary | ICD-10-CM | POA: Diagnosis not present

## 2022-07-26 DIAGNOSIS — Z992 Dependence on renal dialysis: Secondary | ICD-10-CM | POA: Diagnosis not present

## 2022-07-26 DIAGNOSIS — R0789 Other chest pain: Secondary | ICD-10-CM | POA: Diagnosis not present

## 2022-07-26 DIAGNOSIS — I953 Hypotension of hemodialysis: Secondary | ICD-10-CM | POA: Diagnosis not present

## 2022-07-26 DIAGNOSIS — N186 End stage renal disease: Secondary | ICD-10-CM | POA: Diagnosis not present

## 2022-07-26 DIAGNOSIS — R110 Nausea: Secondary | ICD-10-CM | POA: Diagnosis not present

## 2022-07-26 DIAGNOSIS — D631 Anemia in chronic kidney disease: Secondary | ICD-10-CM | POA: Diagnosis not present

## 2022-07-26 DIAGNOSIS — R079 Chest pain, unspecified: Secondary | ICD-10-CM | POA: Diagnosis not present

## 2022-07-26 DIAGNOSIS — R059 Cough, unspecified: Secondary | ICD-10-CM | POA: Diagnosis not present

## 2022-07-26 DIAGNOSIS — E211 Secondary hyperparathyroidism, not elsewhere classified: Secondary | ICD-10-CM | POA: Diagnosis not present

## 2022-07-26 DIAGNOSIS — E8770 Fluid overload, unspecified: Secondary | ICD-10-CM | POA: Diagnosis not present

## 2022-07-26 DIAGNOSIS — Z79899 Other long term (current) drug therapy: Secondary | ICD-10-CM | POA: Diagnosis not present

## 2022-07-26 DIAGNOSIS — I120 Hypertensive chronic kidney disease with stage 5 chronic kidney disease or end stage renal disease: Secondary | ICD-10-CM | POA: Diagnosis not present

## 2022-07-27 DIAGNOSIS — E211 Secondary hyperparathyroidism, not elsewhere classified: Secondary | ICD-10-CM | POA: Diagnosis not present

## 2022-07-27 DIAGNOSIS — E8770 Fluid overload, unspecified: Secondary | ICD-10-CM | POA: Diagnosis not present

## 2022-07-27 DIAGNOSIS — D631 Anemia in chronic kidney disease: Secondary | ICD-10-CM | POA: Diagnosis not present

## 2022-07-27 DIAGNOSIS — N186 End stage renal disease: Secondary | ICD-10-CM | POA: Diagnosis not present

## 2022-07-27 DIAGNOSIS — Z992 Dependence on renal dialysis: Secondary | ICD-10-CM | POA: Diagnosis not present

## 2022-07-27 DIAGNOSIS — R0600 Dyspnea, unspecified: Secondary | ICD-10-CM | POA: Diagnosis not present

## 2022-07-31 DIAGNOSIS — D631 Anemia in chronic kidney disease: Secondary | ICD-10-CM | POA: Diagnosis not present

## 2022-07-31 DIAGNOSIS — Z23 Encounter for immunization: Secondary | ICD-10-CM | POA: Diagnosis not present

## 2022-07-31 DIAGNOSIS — Z992 Dependence on renal dialysis: Secondary | ICD-10-CM | POA: Diagnosis not present

## 2022-07-31 DIAGNOSIS — N2581 Secondary hyperparathyroidism of renal origin: Secondary | ICD-10-CM | POA: Diagnosis not present

## 2022-07-31 DIAGNOSIS — D509 Iron deficiency anemia, unspecified: Secondary | ICD-10-CM | POA: Diagnosis not present

## 2022-07-31 DIAGNOSIS — N186 End stage renal disease: Secondary | ICD-10-CM | POA: Diagnosis not present

## 2022-08-02 DIAGNOSIS — Z992 Dependence on renal dialysis: Secondary | ICD-10-CM | POA: Diagnosis not present

## 2022-08-02 DIAGNOSIS — D631 Anemia in chronic kidney disease: Secondary | ICD-10-CM | POA: Diagnosis not present

## 2022-08-02 DIAGNOSIS — D509 Iron deficiency anemia, unspecified: Secondary | ICD-10-CM | POA: Diagnosis not present

## 2022-08-02 DIAGNOSIS — N2581 Secondary hyperparathyroidism of renal origin: Secondary | ICD-10-CM | POA: Diagnosis not present

## 2022-08-02 DIAGNOSIS — Z23 Encounter for immunization: Secondary | ICD-10-CM | POA: Diagnosis not present

## 2022-08-02 DIAGNOSIS — N186 End stage renal disease: Secondary | ICD-10-CM | POA: Diagnosis not present

## 2022-08-05 DIAGNOSIS — D631 Anemia in chronic kidney disease: Secondary | ICD-10-CM | POA: Diagnosis not present

## 2022-08-05 DIAGNOSIS — Z23 Encounter for immunization: Secondary | ICD-10-CM | POA: Diagnosis not present

## 2022-08-05 DIAGNOSIS — Z992 Dependence on renal dialysis: Secondary | ICD-10-CM | POA: Diagnosis not present

## 2022-08-05 DIAGNOSIS — D509 Iron deficiency anemia, unspecified: Secondary | ICD-10-CM | POA: Diagnosis not present

## 2022-08-05 DIAGNOSIS — N2581 Secondary hyperparathyroidism of renal origin: Secondary | ICD-10-CM | POA: Diagnosis not present

## 2022-08-05 DIAGNOSIS — N186 End stage renal disease: Secondary | ICD-10-CM | POA: Diagnosis not present

## 2022-08-07 DIAGNOSIS — N186 End stage renal disease: Secondary | ICD-10-CM | POA: Diagnosis not present

## 2022-08-07 DIAGNOSIS — D509 Iron deficiency anemia, unspecified: Secondary | ICD-10-CM | POA: Diagnosis not present

## 2022-08-07 DIAGNOSIS — Z992 Dependence on renal dialysis: Secondary | ICD-10-CM | POA: Diagnosis not present

## 2022-08-07 DIAGNOSIS — Z23 Encounter for immunization: Secondary | ICD-10-CM | POA: Diagnosis not present

## 2022-08-07 DIAGNOSIS — D631 Anemia in chronic kidney disease: Secondary | ICD-10-CM | POA: Diagnosis not present

## 2022-08-07 DIAGNOSIS — N2581 Secondary hyperparathyroidism of renal origin: Secondary | ICD-10-CM | POA: Diagnosis not present

## 2022-08-09 DIAGNOSIS — Z23 Encounter for immunization: Secondary | ICD-10-CM | POA: Diagnosis not present

## 2022-08-09 DIAGNOSIS — D509 Iron deficiency anemia, unspecified: Secondary | ICD-10-CM | POA: Diagnosis not present

## 2022-08-09 DIAGNOSIS — N2581 Secondary hyperparathyroidism of renal origin: Secondary | ICD-10-CM | POA: Diagnosis not present

## 2022-08-09 DIAGNOSIS — N186 End stage renal disease: Secondary | ICD-10-CM | POA: Diagnosis not present

## 2022-08-09 DIAGNOSIS — Z992 Dependence on renal dialysis: Secondary | ICD-10-CM | POA: Diagnosis not present

## 2022-08-09 DIAGNOSIS — D631 Anemia in chronic kidney disease: Secondary | ICD-10-CM | POA: Diagnosis not present

## 2022-08-12 DIAGNOSIS — D631 Anemia in chronic kidney disease: Secondary | ICD-10-CM | POA: Diagnosis not present

## 2022-08-12 DIAGNOSIS — N186 End stage renal disease: Secondary | ICD-10-CM | POA: Diagnosis not present

## 2022-08-12 DIAGNOSIS — N2581 Secondary hyperparathyroidism of renal origin: Secondary | ICD-10-CM | POA: Diagnosis not present

## 2022-08-12 DIAGNOSIS — Z992 Dependence on renal dialysis: Secondary | ICD-10-CM | POA: Diagnosis not present

## 2022-08-12 DIAGNOSIS — Z23 Encounter for immunization: Secondary | ICD-10-CM | POA: Diagnosis not present

## 2022-08-12 DIAGNOSIS — D509 Iron deficiency anemia, unspecified: Secondary | ICD-10-CM | POA: Diagnosis not present

## 2022-08-13 DIAGNOSIS — N186 End stage renal disease: Secondary | ICD-10-CM | POA: Diagnosis not present

## 2022-08-14 DIAGNOSIS — N2581 Secondary hyperparathyroidism of renal origin: Secondary | ICD-10-CM | POA: Diagnosis not present

## 2022-08-14 DIAGNOSIS — D509 Iron deficiency anemia, unspecified: Secondary | ICD-10-CM | POA: Diagnosis not present

## 2022-08-14 DIAGNOSIS — N186 End stage renal disease: Secondary | ICD-10-CM | POA: Diagnosis not present

## 2022-08-14 DIAGNOSIS — D631 Anemia in chronic kidney disease: Secondary | ICD-10-CM | POA: Diagnosis not present

## 2022-08-14 DIAGNOSIS — Z992 Dependence on renal dialysis: Secondary | ICD-10-CM | POA: Diagnosis not present

## 2022-08-17 DIAGNOSIS — D509 Iron deficiency anemia, unspecified: Secondary | ICD-10-CM | POA: Diagnosis not present

## 2022-08-17 DIAGNOSIS — D631 Anemia in chronic kidney disease: Secondary | ICD-10-CM | POA: Diagnosis not present

## 2022-08-17 DIAGNOSIS — Z992 Dependence on renal dialysis: Secondary | ICD-10-CM | POA: Diagnosis not present

## 2022-08-17 DIAGNOSIS — N2581 Secondary hyperparathyroidism of renal origin: Secondary | ICD-10-CM | POA: Diagnosis not present

## 2022-08-17 DIAGNOSIS — N186 End stage renal disease: Secondary | ICD-10-CM | POA: Diagnosis not present

## 2022-08-19 DIAGNOSIS — H4051X4 Glaucoma secondary to other eye disorders, right eye, indeterminate stage: Secondary | ICD-10-CM | POA: Diagnosis not present

## 2022-08-19 DIAGNOSIS — Z9989 Dependence on other enabling machines and devices: Secondary | ICD-10-CM | POA: Diagnosis not present

## 2022-08-19 DIAGNOSIS — Z6841 Body Mass Index (BMI) 40.0 and over, adult: Secondary | ICD-10-CM | POA: Diagnosis not present

## 2022-08-19 DIAGNOSIS — Z888 Allergy status to other drugs, medicaments and biological substances status: Secondary | ICD-10-CM | POA: Diagnosis not present

## 2022-08-19 DIAGNOSIS — E669 Obesity, unspecified: Secondary | ICD-10-CM | POA: Diagnosis not present

## 2022-08-19 DIAGNOSIS — M8580 Other specified disorders of bone density and structure, unspecified site: Secondary | ICD-10-CM | POA: Diagnosis not present

## 2022-08-19 DIAGNOSIS — G4733 Obstructive sleep apnea (adult) (pediatric): Secondary | ICD-10-CM | POA: Diagnosis not present

## 2022-08-19 DIAGNOSIS — R519 Headache, unspecified: Secondary | ICD-10-CM | POA: Diagnosis not present

## 2022-08-19 DIAGNOSIS — H538 Other visual disturbances: Secondary | ICD-10-CM | POA: Diagnosis not present

## 2022-08-19 DIAGNOSIS — I120 Hypertensive chronic kidney disease with stage 5 chronic kidney disease or end stage renal disease: Secondary | ICD-10-CM | POA: Diagnosis not present

## 2022-08-19 DIAGNOSIS — H4089 Other specified glaucoma: Secondary | ICD-10-CM | POA: Diagnosis not present

## 2022-08-19 DIAGNOSIS — F329 Major depressive disorder, single episode, unspecified: Secondary | ICD-10-CM | POA: Diagnosis not present

## 2022-08-19 DIAGNOSIS — N2581 Secondary hyperparathyroidism of renal origin: Secondary | ICD-10-CM | POA: Diagnosis not present

## 2022-08-19 DIAGNOSIS — Z880 Allergy status to penicillin: Secondary | ICD-10-CM | POA: Diagnosis not present

## 2022-08-19 DIAGNOSIS — N186 End stage renal disease: Secondary | ICD-10-CM | POA: Diagnosis not present

## 2022-08-19 DIAGNOSIS — Z992 Dependence on renal dialysis: Secondary | ICD-10-CM | POA: Diagnosis not present

## 2022-08-19 DIAGNOSIS — H40051 Ocular hypertension, right eye: Secondary | ICD-10-CM | POA: Diagnosis not present

## 2022-08-19 DIAGNOSIS — H2101 Hyphema, right eye: Secondary | ICD-10-CM | POA: Diagnosis not present

## 2022-08-19 DIAGNOSIS — D631 Anemia in chronic kidney disease: Secondary | ICD-10-CM | POA: Diagnosis not present

## 2022-08-19 DIAGNOSIS — Z794 Long term (current) use of insulin: Secondary | ICD-10-CM | POA: Diagnosis not present

## 2022-08-19 DIAGNOSIS — Z885 Allergy status to narcotic agent status: Secondary | ICD-10-CM | POA: Diagnosis not present

## 2022-08-19 DIAGNOSIS — Z9181 History of falling: Secondary | ICD-10-CM | POA: Diagnosis not present

## 2022-08-19 DIAGNOSIS — H1811 Bullous keratopathy, right eye: Secondary | ICD-10-CM | POA: Diagnosis not present

## 2022-08-19 DIAGNOSIS — H4051X2 Glaucoma secondary to other eye disorders, right eye, moderate stage: Secondary | ICD-10-CM | POA: Diagnosis not present

## 2022-08-19 DIAGNOSIS — H5711 Ocular pain, right eye: Secondary | ICD-10-CM | POA: Diagnosis not present

## 2022-08-19 DIAGNOSIS — E1122 Type 2 diabetes mellitus with diabetic chronic kidney disease: Secondary | ICD-10-CM | POA: Diagnosis not present

## 2022-08-19 DIAGNOSIS — Z79899 Other long term (current) drug therapy: Secondary | ICD-10-CM | POA: Diagnosis not present

## 2022-08-19 DIAGNOSIS — I10 Essential (primary) hypertension: Secondary | ICD-10-CM | POA: Diagnosis not present

## 2022-08-20 DIAGNOSIS — H5442A5 Blindness left eye category 5, normal vision right eye: Secondary | ICD-10-CM | POA: Diagnosis not present

## 2022-08-20 DIAGNOSIS — H1811 Bullous keratopathy, right eye: Secondary | ICD-10-CM | POA: Diagnosis not present

## 2022-08-20 DIAGNOSIS — H2101 Hyphema, right eye: Secondary | ICD-10-CM | POA: Diagnosis not present

## 2022-08-20 DIAGNOSIS — N186 End stage renal disease: Secondary | ICD-10-CM | POA: Diagnosis not present

## 2022-08-20 DIAGNOSIS — I120 Hypertensive chronic kidney disease with stage 5 chronic kidney disease or end stage renal disease: Secondary | ICD-10-CM | POA: Diagnosis not present

## 2022-08-20 DIAGNOSIS — D631 Anemia in chronic kidney disease: Secondary | ICD-10-CM | POA: Diagnosis not present

## 2022-08-20 DIAGNOSIS — H4089 Other specified glaucoma: Secondary | ICD-10-CM | POA: Diagnosis not present

## 2022-08-20 DIAGNOSIS — Z992 Dependence on renal dialysis: Secondary | ICD-10-CM | POA: Diagnosis not present

## 2022-08-23 DIAGNOSIS — N2581 Secondary hyperparathyroidism of renal origin: Secondary | ICD-10-CM | POA: Diagnosis not present

## 2022-08-23 DIAGNOSIS — D509 Iron deficiency anemia, unspecified: Secondary | ICD-10-CM | POA: Diagnosis not present

## 2022-08-23 DIAGNOSIS — Z992 Dependence on renal dialysis: Secondary | ICD-10-CM | POA: Diagnosis not present

## 2022-08-23 DIAGNOSIS — D631 Anemia in chronic kidney disease: Secondary | ICD-10-CM | POA: Diagnosis not present

## 2022-08-23 DIAGNOSIS — N186 End stage renal disease: Secondary | ICD-10-CM | POA: Diagnosis not present

## 2022-08-26 DIAGNOSIS — D509 Iron deficiency anemia, unspecified: Secondary | ICD-10-CM | POA: Diagnosis not present

## 2022-08-26 DIAGNOSIS — N250 Renal osteodystrophy: Secondary | ICD-10-CM | POA: Diagnosis not present

## 2022-08-26 DIAGNOSIS — D631 Anemia in chronic kidney disease: Secondary | ICD-10-CM | POA: Diagnosis not present

## 2022-08-26 DIAGNOSIS — Z992 Dependence on renal dialysis: Secondary | ICD-10-CM | POA: Diagnosis not present

## 2022-08-26 DIAGNOSIS — N186 End stage renal disease: Secondary | ICD-10-CM | POA: Diagnosis not present

## 2022-08-26 DIAGNOSIS — N2581 Secondary hyperparathyroidism of renal origin: Secondary | ICD-10-CM | POA: Diagnosis not present

## 2022-08-30 DIAGNOSIS — E1165 Type 2 diabetes mellitus with hyperglycemia: Secondary | ICD-10-CM | POA: Diagnosis not present

## 2022-08-30 DIAGNOSIS — Z992 Dependence on renal dialysis: Secondary | ICD-10-CM | POA: Diagnosis not present

## 2022-08-30 DIAGNOSIS — N2581 Secondary hyperparathyroidism of renal origin: Secondary | ICD-10-CM | POA: Diagnosis not present

## 2022-08-30 DIAGNOSIS — D631 Anemia in chronic kidney disease: Secondary | ICD-10-CM | POA: Diagnosis not present

## 2022-08-30 DIAGNOSIS — E1122 Type 2 diabetes mellitus with diabetic chronic kidney disease: Secondary | ICD-10-CM | POA: Diagnosis not present

## 2022-08-30 DIAGNOSIS — D509 Iron deficiency anemia, unspecified: Secondary | ICD-10-CM | POA: Diagnosis not present

## 2022-08-30 DIAGNOSIS — N186 End stage renal disease: Secondary | ICD-10-CM | POA: Diagnosis not present

## 2022-09-02 DIAGNOSIS — D509 Iron deficiency anemia, unspecified: Secondary | ICD-10-CM | POA: Diagnosis not present

## 2022-09-02 DIAGNOSIS — H1811 Bullous keratopathy, right eye: Secondary | ICD-10-CM | POA: Diagnosis not present

## 2022-09-02 DIAGNOSIS — H5442A5 Blindness left eye category 5, normal vision right eye: Secondary | ICD-10-CM | POA: Diagnosis not present

## 2022-09-02 DIAGNOSIS — Z992 Dependence on renal dialysis: Secondary | ICD-10-CM | POA: Diagnosis not present

## 2022-09-02 DIAGNOSIS — H4053X3 Glaucoma secondary to other eye disorders, bilateral, severe stage: Secondary | ICD-10-CM | POA: Diagnosis not present

## 2022-09-02 DIAGNOSIS — H2101 Hyphema, right eye: Secondary | ICD-10-CM | POA: Diagnosis not present

## 2022-09-02 DIAGNOSIS — N186 End stage renal disease: Secondary | ICD-10-CM | POA: Diagnosis not present

## 2022-09-02 DIAGNOSIS — D631 Anemia in chronic kidney disease: Secondary | ICD-10-CM | POA: Diagnosis not present

## 2022-09-02 DIAGNOSIS — N2581 Secondary hyperparathyroidism of renal origin: Secondary | ICD-10-CM | POA: Diagnosis not present

## 2022-09-04 DIAGNOSIS — N2581 Secondary hyperparathyroidism of renal origin: Secondary | ICD-10-CM | POA: Diagnosis not present

## 2022-09-04 DIAGNOSIS — D631 Anemia in chronic kidney disease: Secondary | ICD-10-CM | POA: Diagnosis not present

## 2022-09-04 DIAGNOSIS — Z992 Dependence on renal dialysis: Secondary | ICD-10-CM | POA: Diagnosis not present

## 2022-09-04 DIAGNOSIS — N186 End stage renal disease: Secondary | ICD-10-CM | POA: Diagnosis not present

## 2022-09-04 DIAGNOSIS — D509 Iron deficiency anemia, unspecified: Secondary | ICD-10-CM | POA: Diagnosis not present

## 2022-09-06 DIAGNOSIS — N2581 Secondary hyperparathyroidism of renal origin: Secondary | ICD-10-CM | POA: Diagnosis not present

## 2022-09-06 DIAGNOSIS — Z992 Dependence on renal dialysis: Secondary | ICD-10-CM | POA: Diagnosis not present

## 2022-09-06 DIAGNOSIS — D631 Anemia in chronic kidney disease: Secondary | ICD-10-CM | POA: Diagnosis not present

## 2022-09-06 DIAGNOSIS — N186 End stage renal disease: Secondary | ICD-10-CM | POA: Diagnosis not present

## 2022-09-06 DIAGNOSIS — D509 Iron deficiency anemia, unspecified: Secondary | ICD-10-CM | POA: Diagnosis not present

## 2022-09-09 DIAGNOSIS — D631 Anemia in chronic kidney disease: Secondary | ICD-10-CM | POA: Diagnosis not present

## 2022-09-09 DIAGNOSIS — D509 Iron deficiency anemia, unspecified: Secondary | ICD-10-CM | POA: Diagnosis not present

## 2022-09-09 DIAGNOSIS — N186 End stage renal disease: Secondary | ICD-10-CM | POA: Diagnosis not present

## 2022-09-09 DIAGNOSIS — N2581 Secondary hyperparathyroidism of renal origin: Secondary | ICD-10-CM | POA: Diagnosis not present

## 2022-09-09 DIAGNOSIS — Z992 Dependence on renal dialysis: Secondary | ICD-10-CM | POA: Diagnosis not present

## 2022-09-11 DIAGNOSIS — N186 End stage renal disease: Secondary | ICD-10-CM | POA: Diagnosis not present

## 2022-09-11 DIAGNOSIS — Z992 Dependence on renal dialysis: Secondary | ICD-10-CM | POA: Diagnosis not present

## 2022-09-11 DIAGNOSIS — D631 Anemia in chronic kidney disease: Secondary | ICD-10-CM | POA: Diagnosis not present

## 2022-09-11 DIAGNOSIS — N2581 Secondary hyperparathyroidism of renal origin: Secondary | ICD-10-CM | POA: Diagnosis not present

## 2022-09-11 DIAGNOSIS — D509 Iron deficiency anemia, unspecified: Secondary | ICD-10-CM | POA: Diagnosis not present

## 2022-09-12 DIAGNOSIS — N186 End stage renal disease: Secondary | ICD-10-CM | POA: Diagnosis not present

## 2022-09-13 DIAGNOSIS — N2581 Secondary hyperparathyroidism of renal origin: Secondary | ICD-10-CM | POA: Diagnosis not present

## 2022-09-13 DIAGNOSIS — Z992 Dependence on renal dialysis: Secondary | ICD-10-CM | POA: Diagnosis not present

## 2022-09-13 DIAGNOSIS — D509 Iron deficiency anemia, unspecified: Secondary | ICD-10-CM | POA: Diagnosis not present

## 2022-09-13 DIAGNOSIS — D631 Anemia in chronic kidney disease: Secondary | ICD-10-CM | POA: Diagnosis not present

## 2022-09-13 DIAGNOSIS — N186 End stage renal disease: Secondary | ICD-10-CM | POA: Diagnosis not present

## 2022-09-16 DIAGNOSIS — N250 Renal osteodystrophy: Secondary | ICD-10-CM | POA: Diagnosis not present

## 2022-09-16 DIAGNOSIS — D631 Anemia in chronic kidney disease: Secondary | ICD-10-CM | POA: Diagnosis not present

## 2022-09-16 DIAGNOSIS — Z992 Dependence on renal dialysis: Secondary | ICD-10-CM | POA: Diagnosis not present

## 2022-09-16 DIAGNOSIS — N186 End stage renal disease: Secondary | ICD-10-CM | POA: Diagnosis not present

## 2022-09-16 DIAGNOSIS — N2581 Secondary hyperparathyroidism of renal origin: Secondary | ICD-10-CM | POA: Diagnosis not present

## 2022-09-16 DIAGNOSIS — D509 Iron deficiency anemia, unspecified: Secondary | ICD-10-CM | POA: Diagnosis not present

## 2022-09-18 DIAGNOSIS — D509 Iron deficiency anemia, unspecified: Secondary | ICD-10-CM | POA: Diagnosis not present

## 2022-09-18 DIAGNOSIS — E1122 Type 2 diabetes mellitus with diabetic chronic kidney disease: Secondary | ICD-10-CM | POA: Diagnosis not present

## 2022-09-18 DIAGNOSIS — N2581 Secondary hyperparathyroidism of renal origin: Secondary | ICD-10-CM | POA: Diagnosis not present

## 2022-09-18 DIAGNOSIS — I120 Hypertensive chronic kidney disease with stage 5 chronic kidney disease or end stage renal disease: Secondary | ICD-10-CM | POA: Diagnosis not present

## 2022-09-18 DIAGNOSIS — Z9181 History of falling: Secondary | ICD-10-CM | POA: Diagnosis not present

## 2022-09-18 DIAGNOSIS — Z992 Dependence on renal dialysis: Secondary | ICD-10-CM | POA: Diagnosis not present

## 2022-09-18 DIAGNOSIS — N186 End stage renal disease: Secondary | ICD-10-CM | POA: Diagnosis not present

## 2022-09-18 DIAGNOSIS — M8580 Other specified disorders of bone density and structure, unspecified site: Secondary | ICD-10-CM | POA: Diagnosis not present

## 2022-09-18 DIAGNOSIS — D631 Anemia in chronic kidney disease: Secondary | ICD-10-CM | POA: Diagnosis not present

## 2022-09-18 DIAGNOSIS — Z794 Long term (current) use of insulin: Secondary | ICD-10-CM | POA: Diagnosis not present

## 2022-09-20 DIAGNOSIS — N186 End stage renal disease: Secondary | ICD-10-CM | POA: Diagnosis not present

## 2022-09-20 DIAGNOSIS — D631 Anemia in chronic kidney disease: Secondary | ICD-10-CM | POA: Diagnosis not present

## 2022-09-20 DIAGNOSIS — D509 Iron deficiency anemia, unspecified: Secondary | ICD-10-CM | POA: Diagnosis not present

## 2022-09-20 DIAGNOSIS — Z992 Dependence on renal dialysis: Secondary | ICD-10-CM | POA: Diagnosis not present

## 2022-09-20 DIAGNOSIS — N2581 Secondary hyperparathyroidism of renal origin: Secondary | ICD-10-CM | POA: Diagnosis not present

## 2022-09-23 DIAGNOSIS — E113591 Type 2 diabetes mellitus with proliferative diabetic retinopathy without macular edema, right eye: Secondary | ICD-10-CM | POA: Diagnosis not present

## 2022-09-23 DIAGNOSIS — D509 Iron deficiency anemia, unspecified: Secondary | ICD-10-CM | POA: Diagnosis not present

## 2022-09-23 DIAGNOSIS — H3589 Other specified retinal disorders: Secondary | ICD-10-CM | POA: Diagnosis not present

## 2022-09-23 DIAGNOSIS — Z794 Long term (current) use of insulin: Secondary | ICD-10-CM | POA: Diagnosis not present

## 2022-09-23 DIAGNOSIS — N186 End stage renal disease: Secondary | ICD-10-CM | POA: Diagnosis not present

## 2022-09-23 DIAGNOSIS — Z992 Dependence on renal dialysis: Secondary | ICD-10-CM | POA: Diagnosis not present

## 2022-09-23 DIAGNOSIS — D631 Anemia in chronic kidney disease: Secondary | ICD-10-CM | POA: Diagnosis not present

## 2022-09-23 DIAGNOSIS — H4051X3 Glaucoma secondary to other eye disorders, right eye, severe stage: Secondary | ICD-10-CM | POA: Diagnosis not present

## 2022-09-23 DIAGNOSIS — N2581 Secondary hyperparathyroidism of renal origin: Secondary | ICD-10-CM | POA: Diagnosis not present

## 2022-09-24 DIAGNOSIS — E1165 Type 2 diabetes mellitus with hyperglycemia: Secondary | ICD-10-CM | POA: Diagnosis not present

## 2022-09-24 DIAGNOSIS — N186 End stage renal disease: Secondary | ICD-10-CM | POA: Diagnosis not present

## 2022-09-24 DIAGNOSIS — E1122 Type 2 diabetes mellitus with diabetic chronic kidney disease: Secondary | ICD-10-CM | POA: Diagnosis not present

## 2022-09-24 DIAGNOSIS — R2681 Unsteadiness on feet: Secondary | ICD-10-CM | POA: Diagnosis not present

## 2022-09-24 DIAGNOSIS — I120 Hypertensive chronic kidney disease with stage 5 chronic kidney disease or end stage renal disease: Secondary | ICD-10-CM | POA: Diagnosis not present

## 2022-09-26 DIAGNOSIS — I10 Essential (primary) hypertension: Secondary | ICD-10-CM | POA: Diagnosis not present

## 2022-09-26 DIAGNOSIS — G4733 Obstructive sleep apnea (adult) (pediatric): Secondary | ICD-10-CM | POA: Diagnosis not present

## 2022-09-27 DIAGNOSIS — D509 Iron deficiency anemia, unspecified: Secondary | ICD-10-CM | POA: Diagnosis not present

## 2022-09-27 DIAGNOSIS — D631 Anemia in chronic kidney disease: Secondary | ICD-10-CM | POA: Diagnosis not present

## 2022-09-27 DIAGNOSIS — Z992 Dependence on renal dialysis: Secondary | ICD-10-CM | POA: Diagnosis not present

## 2022-09-27 DIAGNOSIS — H4089 Other specified glaucoma: Secondary | ICD-10-CM | POA: Diagnosis not present

## 2022-09-27 DIAGNOSIS — N2581 Secondary hyperparathyroidism of renal origin: Secondary | ICD-10-CM | POA: Diagnosis not present

## 2022-09-27 DIAGNOSIS — N186 End stage renal disease: Secondary | ICD-10-CM | POA: Diagnosis not present

## 2022-09-27 DIAGNOSIS — H18422 Band keratopathy, left eye: Secondary | ICD-10-CM | POA: Diagnosis not present

## 2022-09-27 DIAGNOSIS — E113591 Type 2 diabetes mellitus with proliferative diabetic retinopathy without macular edema, right eye: Secondary | ICD-10-CM | POA: Diagnosis not present

## 2022-09-27 DIAGNOSIS — H2101 Hyphema, right eye: Secondary | ICD-10-CM | POA: Diagnosis not present

## 2022-10-13 DIAGNOSIS — N186 End stage renal disease: Secondary | ICD-10-CM | POA: Diagnosis not present

## 2022-10-14 DIAGNOSIS — D509 Iron deficiency anemia, unspecified: Secondary | ICD-10-CM | POA: Diagnosis not present

## 2022-10-14 DIAGNOSIS — N2581 Secondary hyperparathyroidism of renal origin: Secondary | ICD-10-CM | POA: Diagnosis not present

## 2022-10-14 DIAGNOSIS — N186 End stage renal disease: Secondary | ICD-10-CM | POA: Diagnosis not present

## 2022-10-14 DIAGNOSIS — Z992 Dependence on renal dialysis: Secondary | ICD-10-CM | POA: Diagnosis not present

## 2022-10-14 DIAGNOSIS — Z23 Encounter for immunization: Secondary | ICD-10-CM | POA: Diagnosis not present

## 2022-10-14 DIAGNOSIS — D631 Anemia in chronic kidney disease: Secondary | ICD-10-CM | POA: Diagnosis not present

## 2022-10-16 DIAGNOSIS — Z23 Encounter for immunization: Secondary | ICD-10-CM | POA: Diagnosis not present

## 2022-10-16 DIAGNOSIS — N250 Renal osteodystrophy: Secondary | ICD-10-CM | POA: Diagnosis not present

## 2022-10-16 DIAGNOSIS — Z992 Dependence on renal dialysis: Secondary | ICD-10-CM | POA: Diagnosis not present

## 2022-10-16 DIAGNOSIS — N2581 Secondary hyperparathyroidism of renal origin: Secondary | ICD-10-CM | POA: Diagnosis not present

## 2022-10-16 DIAGNOSIS — D631 Anemia in chronic kidney disease: Secondary | ICD-10-CM | POA: Diagnosis not present

## 2022-10-16 DIAGNOSIS — D509 Iron deficiency anemia, unspecified: Secondary | ICD-10-CM | POA: Diagnosis not present

## 2022-10-16 DIAGNOSIS — N186 End stage renal disease: Secondary | ICD-10-CM | POA: Diagnosis not present

## 2022-10-18 DIAGNOSIS — D631 Anemia in chronic kidney disease: Secondary | ICD-10-CM | POA: Diagnosis not present

## 2022-10-18 DIAGNOSIS — Z23 Encounter for immunization: Secondary | ICD-10-CM | POA: Diagnosis not present

## 2022-10-18 DIAGNOSIS — N2581 Secondary hyperparathyroidism of renal origin: Secondary | ICD-10-CM | POA: Diagnosis not present

## 2022-10-18 DIAGNOSIS — N186 End stage renal disease: Secondary | ICD-10-CM | POA: Diagnosis not present

## 2022-10-18 DIAGNOSIS — D509 Iron deficiency anemia, unspecified: Secondary | ICD-10-CM | POA: Diagnosis not present

## 2022-10-18 DIAGNOSIS — Z992 Dependence on renal dialysis: Secondary | ICD-10-CM | POA: Diagnosis not present

## 2022-10-21 DIAGNOSIS — Z992 Dependence on renal dialysis: Secondary | ICD-10-CM | POA: Diagnosis not present

## 2022-10-21 DIAGNOSIS — Z23 Encounter for immunization: Secondary | ICD-10-CM | POA: Diagnosis not present

## 2022-10-21 DIAGNOSIS — D631 Anemia in chronic kidney disease: Secondary | ICD-10-CM | POA: Diagnosis not present

## 2022-10-21 DIAGNOSIS — N2581 Secondary hyperparathyroidism of renal origin: Secondary | ICD-10-CM | POA: Diagnosis not present

## 2022-10-21 DIAGNOSIS — N186 End stage renal disease: Secondary | ICD-10-CM | POA: Diagnosis not present

## 2022-10-21 DIAGNOSIS — D509 Iron deficiency anemia, unspecified: Secondary | ICD-10-CM | POA: Diagnosis not present

## 2022-10-22 DIAGNOSIS — N186 End stage renal disease: Secondary | ICD-10-CM | POA: Diagnosis not present

## 2022-10-22 DIAGNOSIS — I120 Hypertensive chronic kidney disease with stage 5 chronic kidney disease or end stage renal disease: Secondary | ICD-10-CM | POA: Diagnosis not present

## 2022-10-22 DIAGNOSIS — E1122 Type 2 diabetes mellitus with diabetic chronic kidney disease: Secondary | ICD-10-CM | POA: Diagnosis not present

## 2022-10-22 DIAGNOSIS — Z992 Dependence on renal dialysis: Secondary | ICD-10-CM | POA: Diagnosis not present

## 2022-10-23 DIAGNOSIS — Z23 Encounter for immunization: Secondary | ICD-10-CM | POA: Diagnosis not present

## 2022-10-23 DIAGNOSIS — D631 Anemia in chronic kidney disease: Secondary | ICD-10-CM | POA: Diagnosis not present

## 2022-10-23 DIAGNOSIS — D509 Iron deficiency anemia, unspecified: Secondary | ICD-10-CM | POA: Diagnosis not present

## 2022-10-23 DIAGNOSIS — Z992 Dependence on renal dialysis: Secondary | ICD-10-CM | POA: Diagnosis not present

## 2022-10-23 DIAGNOSIS — N2581 Secondary hyperparathyroidism of renal origin: Secondary | ICD-10-CM | POA: Diagnosis not present

## 2022-10-23 DIAGNOSIS — N186 End stage renal disease: Secondary | ICD-10-CM | POA: Diagnosis not present

## 2022-10-25 DIAGNOSIS — D509 Iron deficiency anemia, unspecified: Secondary | ICD-10-CM | POA: Diagnosis not present

## 2022-10-25 DIAGNOSIS — D631 Anemia in chronic kidney disease: Secondary | ICD-10-CM | POA: Diagnosis not present

## 2022-10-25 DIAGNOSIS — Z992 Dependence on renal dialysis: Secondary | ICD-10-CM | POA: Diagnosis not present

## 2022-10-25 DIAGNOSIS — Z23 Encounter for immunization: Secondary | ICD-10-CM | POA: Diagnosis not present

## 2022-10-25 DIAGNOSIS — N2581 Secondary hyperparathyroidism of renal origin: Secondary | ICD-10-CM | POA: Diagnosis not present

## 2022-10-25 DIAGNOSIS — N186 End stage renal disease: Secondary | ICD-10-CM | POA: Diagnosis not present

## 2022-10-28 DIAGNOSIS — D631 Anemia in chronic kidney disease: Secondary | ICD-10-CM | POA: Diagnosis not present

## 2022-10-28 DIAGNOSIS — N186 End stage renal disease: Secondary | ICD-10-CM | POA: Diagnosis not present

## 2022-10-28 DIAGNOSIS — N2581 Secondary hyperparathyroidism of renal origin: Secondary | ICD-10-CM | POA: Diagnosis not present

## 2022-10-28 DIAGNOSIS — D509 Iron deficiency anemia, unspecified: Secondary | ICD-10-CM | POA: Diagnosis not present

## 2022-10-28 DIAGNOSIS — Z23 Encounter for immunization: Secondary | ICD-10-CM | POA: Diagnosis not present

## 2022-10-28 DIAGNOSIS — Z992 Dependence on renal dialysis: Secondary | ICD-10-CM | POA: Diagnosis not present

## 2022-10-30 DIAGNOSIS — N186 End stage renal disease: Secondary | ICD-10-CM | POA: Diagnosis not present

## 2022-10-30 DIAGNOSIS — Z23 Encounter for immunization: Secondary | ICD-10-CM | POA: Diagnosis not present

## 2022-10-30 DIAGNOSIS — D509 Iron deficiency anemia, unspecified: Secondary | ICD-10-CM | POA: Diagnosis not present

## 2022-10-30 DIAGNOSIS — N2581 Secondary hyperparathyroidism of renal origin: Secondary | ICD-10-CM | POA: Diagnosis not present

## 2022-10-30 DIAGNOSIS — Z992 Dependence on renal dialysis: Secondary | ICD-10-CM | POA: Diagnosis not present

## 2022-10-30 DIAGNOSIS — D631 Anemia in chronic kidney disease: Secondary | ICD-10-CM | POA: Diagnosis not present

## 2022-11-01 DIAGNOSIS — D509 Iron deficiency anemia, unspecified: Secondary | ICD-10-CM | POA: Diagnosis not present

## 2022-11-01 DIAGNOSIS — D631 Anemia in chronic kidney disease: Secondary | ICD-10-CM | POA: Diagnosis not present

## 2022-11-01 DIAGNOSIS — Z992 Dependence on renal dialysis: Secondary | ICD-10-CM | POA: Diagnosis not present

## 2022-11-01 DIAGNOSIS — Z23 Encounter for immunization: Secondary | ICD-10-CM | POA: Diagnosis not present

## 2022-11-01 DIAGNOSIS — N2581 Secondary hyperparathyroidism of renal origin: Secondary | ICD-10-CM | POA: Diagnosis not present

## 2022-11-01 DIAGNOSIS — N186 End stage renal disease: Secondary | ICD-10-CM | POA: Diagnosis not present

## 2022-11-04 DIAGNOSIS — H2101 Hyphema, right eye: Secondary | ICD-10-CM | POA: Diagnosis not present

## 2022-11-04 DIAGNOSIS — N186 End stage renal disease: Secondary | ICD-10-CM | POA: Diagnosis not present

## 2022-11-04 DIAGNOSIS — E113591 Type 2 diabetes mellitus with proliferative diabetic retinopathy without macular edema, right eye: Secondary | ICD-10-CM | POA: Diagnosis not present

## 2022-11-04 DIAGNOSIS — N2581 Secondary hyperparathyroidism of renal origin: Secondary | ICD-10-CM | POA: Diagnosis not present

## 2022-11-04 DIAGNOSIS — D631 Anemia in chronic kidney disease: Secondary | ICD-10-CM | POA: Diagnosis not present

## 2022-11-04 DIAGNOSIS — Z23 Encounter for immunization: Secondary | ICD-10-CM | POA: Diagnosis not present

## 2022-11-04 DIAGNOSIS — H18422 Band keratopathy, left eye: Secondary | ICD-10-CM | POA: Diagnosis not present

## 2022-11-04 DIAGNOSIS — Z992 Dependence on renal dialysis: Secondary | ICD-10-CM | POA: Diagnosis not present

## 2022-11-04 DIAGNOSIS — D509 Iron deficiency anemia, unspecified: Secondary | ICD-10-CM | POA: Diagnosis not present

## 2022-11-04 DIAGNOSIS — H4053X3 Glaucoma secondary to other eye disorders, bilateral, severe stage: Secondary | ICD-10-CM | POA: Diagnosis not present

## 2022-11-06 DIAGNOSIS — D509 Iron deficiency anemia, unspecified: Secondary | ICD-10-CM | POA: Diagnosis not present

## 2022-11-06 DIAGNOSIS — N2581 Secondary hyperparathyroidism of renal origin: Secondary | ICD-10-CM | POA: Diagnosis not present

## 2022-11-06 DIAGNOSIS — D631 Anemia in chronic kidney disease: Secondary | ICD-10-CM | POA: Diagnosis not present

## 2022-11-06 DIAGNOSIS — Z23 Encounter for immunization: Secondary | ICD-10-CM | POA: Diagnosis not present

## 2022-11-06 DIAGNOSIS — Z992 Dependence on renal dialysis: Secondary | ICD-10-CM | POA: Diagnosis not present

## 2022-11-06 DIAGNOSIS — N186 End stage renal disease: Secondary | ICD-10-CM | POA: Diagnosis not present

## 2022-11-08 DIAGNOSIS — N2581 Secondary hyperparathyroidism of renal origin: Secondary | ICD-10-CM | POA: Diagnosis not present

## 2022-11-08 DIAGNOSIS — Z23 Encounter for immunization: Secondary | ICD-10-CM | POA: Diagnosis not present

## 2022-11-08 DIAGNOSIS — Z992 Dependence on renal dialysis: Secondary | ICD-10-CM | POA: Diagnosis not present

## 2022-11-08 DIAGNOSIS — N186 End stage renal disease: Secondary | ICD-10-CM | POA: Diagnosis not present

## 2022-11-08 DIAGNOSIS — D509 Iron deficiency anemia, unspecified: Secondary | ICD-10-CM | POA: Diagnosis not present

## 2022-11-08 DIAGNOSIS — D631 Anemia in chronic kidney disease: Secondary | ICD-10-CM | POA: Diagnosis not present

## 2022-11-11 DIAGNOSIS — Z23 Encounter for immunization: Secondary | ICD-10-CM | POA: Diagnosis not present

## 2022-11-11 DIAGNOSIS — D631 Anemia in chronic kidney disease: Secondary | ICD-10-CM | POA: Diagnosis not present

## 2022-11-11 DIAGNOSIS — N2581 Secondary hyperparathyroidism of renal origin: Secondary | ICD-10-CM | POA: Diagnosis not present

## 2022-11-11 DIAGNOSIS — Z992 Dependence on renal dialysis: Secondary | ICD-10-CM | POA: Diagnosis not present

## 2022-11-11 DIAGNOSIS — D509 Iron deficiency anemia, unspecified: Secondary | ICD-10-CM | POA: Diagnosis not present

## 2022-11-11 DIAGNOSIS — N186 End stage renal disease: Secondary | ICD-10-CM | POA: Diagnosis not present

## 2022-11-13 DIAGNOSIS — D631 Anemia in chronic kidney disease: Secondary | ICD-10-CM | POA: Diagnosis not present

## 2022-11-13 DIAGNOSIS — D509 Iron deficiency anemia, unspecified: Secondary | ICD-10-CM | POA: Diagnosis not present

## 2022-11-13 DIAGNOSIS — N186 End stage renal disease: Secondary | ICD-10-CM | POA: Diagnosis not present

## 2022-11-13 DIAGNOSIS — Z992 Dependence on renal dialysis: Secondary | ICD-10-CM | POA: Diagnosis not present

## 2022-11-13 DIAGNOSIS — N2581 Secondary hyperparathyroidism of renal origin: Secondary | ICD-10-CM | POA: Diagnosis not present

## 2022-11-13 DIAGNOSIS — Z23 Encounter for immunization: Secondary | ICD-10-CM | POA: Diagnosis not present

## 2022-11-15 DIAGNOSIS — D631 Anemia in chronic kidney disease: Secondary | ICD-10-CM | POA: Diagnosis not present

## 2022-11-15 DIAGNOSIS — N2581 Secondary hyperparathyroidism of renal origin: Secondary | ICD-10-CM | POA: Diagnosis not present

## 2022-11-15 DIAGNOSIS — N186 End stage renal disease: Secondary | ICD-10-CM | POA: Diagnosis not present

## 2022-11-15 DIAGNOSIS — D509 Iron deficiency anemia, unspecified: Secondary | ICD-10-CM | POA: Diagnosis not present

## 2022-11-15 DIAGNOSIS — Z992 Dependence on renal dialysis: Secondary | ICD-10-CM | POA: Diagnosis not present

## 2022-11-20 DIAGNOSIS — D631 Anemia in chronic kidney disease: Secondary | ICD-10-CM | POA: Diagnosis not present

## 2022-11-20 DIAGNOSIS — D509 Iron deficiency anemia, unspecified: Secondary | ICD-10-CM | POA: Diagnosis not present

## 2022-11-20 DIAGNOSIS — N2581 Secondary hyperparathyroidism of renal origin: Secondary | ICD-10-CM | POA: Diagnosis not present

## 2022-11-20 DIAGNOSIS — N250 Renal osteodystrophy: Secondary | ICD-10-CM | POA: Diagnosis not present

## 2022-11-20 DIAGNOSIS — Z992 Dependence on renal dialysis: Secondary | ICD-10-CM | POA: Diagnosis not present

## 2022-11-20 DIAGNOSIS — N186 End stage renal disease: Secondary | ICD-10-CM | POA: Diagnosis not present

## 2022-11-22 DIAGNOSIS — N186 End stage renal disease: Secondary | ICD-10-CM | POA: Diagnosis not present

## 2022-11-22 DIAGNOSIS — N2581 Secondary hyperparathyroidism of renal origin: Secondary | ICD-10-CM | POA: Diagnosis not present

## 2022-11-22 DIAGNOSIS — D509 Iron deficiency anemia, unspecified: Secondary | ICD-10-CM | POA: Diagnosis not present

## 2022-11-22 DIAGNOSIS — Z992 Dependence on renal dialysis: Secondary | ICD-10-CM | POA: Diagnosis not present

## 2022-11-22 DIAGNOSIS — D631 Anemia in chronic kidney disease: Secondary | ICD-10-CM | POA: Diagnosis not present

## 2022-11-25 DIAGNOSIS — D509 Iron deficiency anemia, unspecified: Secondary | ICD-10-CM | POA: Diagnosis not present

## 2022-11-25 DIAGNOSIS — N2581 Secondary hyperparathyroidism of renal origin: Secondary | ICD-10-CM | POA: Diagnosis not present

## 2022-11-25 DIAGNOSIS — D631 Anemia in chronic kidney disease: Secondary | ICD-10-CM | POA: Diagnosis not present

## 2022-11-25 DIAGNOSIS — Z992 Dependence on renal dialysis: Secondary | ICD-10-CM | POA: Diagnosis not present

## 2022-11-25 DIAGNOSIS — N186 End stage renal disease: Secondary | ICD-10-CM | POA: Diagnosis not present

## 2022-11-26 DIAGNOSIS — E1122 Type 2 diabetes mellitus with diabetic chronic kidney disease: Secondary | ICD-10-CM | POA: Diagnosis not present

## 2022-11-26 DIAGNOSIS — N186 End stage renal disease: Secondary | ICD-10-CM | POA: Diagnosis not present

## 2022-11-26 DIAGNOSIS — Z992 Dependence on renal dialysis: Secondary | ICD-10-CM | POA: Diagnosis not present

## 2022-11-26 DIAGNOSIS — I120 Hypertensive chronic kidney disease with stage 5 chronic kidney disease or end stage renal disease: Secondary | ICD-10-CM | POA: Diagnosis not present

## 2022-11-27 DIAGNOSIS — N2581 Secondary hyperparathyroidism of renal origin: Secondary | ICD-10-CM | POA: Diagnosis not present

## 2022-11-27 DIAGNOSIS — N186 End stage renal disease: Secondary | ICD-10-CM | POA: Diagnosis not present

## 2022-11-27 DIAGNOSIS — D631 Anemia in chronic kidney disease: Secondary | ICD-10-CM | POA: Diagnosis not present

## 2022-11-27 DIAGNOSIS — Z992 Dependence on renal dialysis: Secondary | ICD-10-CM | POA: Diagnosis not present

## 2022-11-27 DIAGNOSIS — D509 Iron deficiency anemia, unspecified: Secondary | ICD-10-CM | POA: Diagnosis not present

## 2022-11-29 DIAGNOSIS — N2581 Secondary hyperparathyroidism of renal origin: Secondary | ICD-10-CM | POA: Diagnosis not present

## 2022-11-29 DIAGNOSIS — Z992 Dependence on renal dialysis: Secondary | ICD-10-CM | POA: Diagnosis not present

## 2022-11-29 DIAGNOSIS — N186 End stage renal disease: Secondary | ICD-10-CM | POA: Diagnosis not present

## 2022-11-29 DIAGNOSIS — D509 Iron deficiency anemia, unspecified: Secondary | ICD-10-CM | POA: Diagnosis not present

## 2022-11-29 DIAGNOSIS — D631 Anemia in chronic kidney disease: Secondary | ICD-10-CM | POA: Diagnosis not present

## 2022-12-02 DIAGNOSIS — N186 End stage renal disease: Secondary | ICD-10-CM | POA: Diagnosis not present

## 2022-12-02 DIAGNOSIS — Z992 Dependence on renal dialysis: Secondary | ICD-10-CM | POA: Diagnosis not present

## 2022-12-02 DIAGNOSIS — N2581 Secondary hyperparathyroidism of renal origin: Secondary | ICD-10-CM | POA: Diagnosis not present

## 2022-12-02 DIAGNOSIS — D631 Anemia in chronic kidney disease: Secondary | ICD-10-CM | POA: Diagnosis not present

## 2022-12-02 DIAGNOSIS — D509 Iron deficiency anemia, unspecified: Secondary | ICD-10-CM | POA: Diagnosis not present

## 2022-12-04 DIAGNOSIS — D631 Anemia in chronic kidney disease: Secondary | ICD-10-CM | POA: Diagnosis not present

## 2022-12-04 DIAGNOSIS — N2581 Secondary hyperparathyroidism of renal origin: Secondary | ICD-10-CM | POA: Diagnosis not present

## 2022-12-04 DIAGNOSIS — D509 Iron deficiency anemia, unspecified: Secondary | ICD-10-CM | POA: Diagnosis not present

## 2022-12-04 DIAGNOSIS — H26491 Other secondary cataract, right eye: Secondary | ICD-10-CM | POA: Diagnosis not present

## 2022-12-04 DIAGNOSIS — N186 End stage renal disease: Secondary | ICD-10-CM | POA: Diagnosis not present

## 2022-12-04 DIAGNOSIS — Z992 Dependence on renal dialysis: Secondary | ICD-10-CM | POA: Diagnosis not present

## 2022-12-05 DIAGNOSIS — Z1231 Encounter for screening mammogram for malignant neoplasm of breast: Secondary | ICD-10-CM | POA: Diagnosis not present

## 2022-12-05 DIAGNOSIS — Z992 Dependence on renal dialysis: Secondary | ICD-10-CM | POA: Diagnosis not present

## 2022-12-05 DIAGNOSIS — E1122 Type 2 diabetes mellitus with diabetic chronic kidney disease: Secondary | ICD-10-CM | POA: Diagnosis not present

## 2022-12-05 DIAGNOSIS — N186 End stage renal disease: Secondary | ICD-10-CM | POA: Diagnosis not present

## 2022-12-06 DIAGNOSIS — N186 End stage renal disease: Secondary | ICD-10-CM | POA: Diagnosis not present

## 2022-12-06 DIAGNOSIS — Z992 Dependence on renal dialysis: Secondary | ICD-10-CM | POA: Diagnosis not present

## 2022-12-06 DIAGNOSIS — D509 Iron deficiency anemia, unspecified: Secondary | ICD-10-CM | POA: Diagnosis not present

## 2022-12-06 DIAGNOSIS — N2581 Secondary hyperparathyroidism of renal origin: Secondary | ICD-10-CM | POA: Diagnosis not present

## 2022-12-06 DIAGNOSIS — D631 Anemia in chronic kidney disease: Secondary | ICD-10-CM | POA: Diagnosis not present

## 2022-12-09 DIAGNOSIS — Z992 Dependence on renal dialysis: Secondary | ICD-10-CM | POA: Diagnosis not present

## 2022-12-09 DIAGNOSIS — N2581 Secondary hyperparathyroidism of renal origin: Secondary | ICD-10-CM | POA: Diagnosis not present

## 2022-12-09 DIAGNOSIS — N186 End stage renal disease: Secondary | ICD-10-CM | POA: Diagnosis not present

## 2022-12-09 DIAGNOSIS — D631 Anemia in chronic kidney disease: Secondary | ICD-10-CM | POA: Diagnosis not present

## 2022-12-09 DIAGNOSIS — D509 Iron deficiency anemia, unspecified: Secondary | ICD-10-CM | POA: Diagnosis not present

## 2022-12-11 DIAGNOSIS — N2581 Secondary hyperparathyroidism of renal origin: Secondary | ICD-10-CM | POA: Diagnosis not present

## 2022-12-11 DIAGNOSIS — D509 Iron deficiency anemia, unspecified: Secondary | ICD-10-CM | POA: Diagnosis not present

## 2022-12-11 DIAGNOSIS — D631 Anemia in chronic kidney disease: Secondary | ICD-10-CM | POA: Diagnosis not present

## 2022-12-11 DIAGNOSIS — N186 End stage renal disease: Secondary | ICD-10-CM | POA: Diagnosis not present

## 2022-12-11 DIAGNOSIS — Z992 Dependence on renal dialysis: Secondary | ICD-10-CM | POA: Diagnosis not present

## 2022-12-12 DIAGNOSIS — N186 End stage renal disease: Secondary | ICD-10-CM | POA: Diagnosis not present

## 2022-12-13 DIAGNOSIS — N2581 Secondary hyperparathyroidism of renal origin: Secondary | ICD-10-CM | POA: Diagnosis not present

## 2022-12-13 DIAGNOSIS — Z992 Dependence on renal dialysis: Secondary | ICD-10-CM | POA: Diagnosis not present

## 2022-12-13 DIAGNOSIS — D631 Anemia in chronic kidney disease: Secondary | ICD-10-CM | POA: Diagnosis not present

## 2022-12-13 DIAGNOSIS — D509 Iron deficiency anemia, unspecified: Secondary | ICD-10-CM | POA: Diagnosis not present

## 2022-12-13 DIAGNOSIS — N186 End stage renal disease: Secondary | ICD-10-CM | POA: Diagnosis not present

## 2022-12-18 DIAGNOSIS — Z992 Dependence on renal dialysis: Secondary | ICD-10-CM | POA: Diagnosis not present

## 2022-12-18 DIAGNOSIS — N250 Renal osteodystrophy: Secondary | ICD-10-CM | POA: Diagnosis not present

## 2022-12-18 DIAGNOSIS — D509 Iron deficiency anemia, unspecified: Secondary | ICD-10-CM | POA: Diagnosis not present

## 2022-12-18 DIAGNOSIS — N2581 Secondary hyperparathyroidism of renal origin: Secondary | ICD-10-CM | POA: Diagnosis not present

## 2022-12-18 DIAGNOSIS — D631 Anemia in chronic kidney disease: Secondary | ICD-10-CM | POA: Diagnosis not present

## 2022-12-18 DIAGNOSIS — N186 End stage renal disease: Secondary | ICD-10-CM | POA: Diagnosis not present

## 2022-12-19 DIAGNOSIS — E1122 Type 2 diabetes mellitus with diabetic chronic kidney disease: Secondary | ICD-10-CM | POA: Diagnosis not present

## 2022-12-19 DIAGNOSIS — Z992 Dependence on renal dialysis: Secondary | ICD-10-CM | POA: Diagnosis not present

## 2022-12-19 DIAGNOSIS — N186 End stage renal disease: Secondary | ICD-10-CM | POA: Diagnosis not present

## 2022-12-20 DIAGNOSIS — D631 Anemia in chronic kidney disease: Secondary | ICD-10-CM | POA: Diagnosis not present

## 2022-12-20 DIAGNOSIS — N2581 Secondary hyperparathyroidism of renal origin: Secondary | ICD-10-CM | POA: Diagnosis not present

## 2022-12-20 DIAGNOSIS — N186 End stage renal disease: Secondary | ICD-10-CM | POA: Diagnosis not present

## 2022-12-20 DIAGNOSIS — Z992 Dependence on renal dialysis: Secondary | ICD-10-CM | POA: Diagnosis not present

## 2022-12-20 DIAGNOSIS — D509 Iron deficiency anemia, unspecified: Secondary | ICD-10-CM | POA: Diagnosis not present

## 2022-12-23 DIAGNOSIS — D509 Iron deficiency anemia, unspecified: Secondary | ICD-10-CM | POA: Diagnosis not present

## 2022-12-23 DIAGNOSIS — N186 End stage renal disease: Secondary | ICD-10-CM | POA: Diagnosis not present

## 2022-12-23 DIAGNOSIS — Z992 Dependence on renal dialysis: Secondary | ICD-10-CM | POA: Diagnosis not present

## 2022-12-23 DIAGNOSIS — N2581 Secondary hyperparathyroidism of renal origin: Secondary | ICD-10-CM | POA: Diagnosis not present

## 2022-12-23 DIAGNOSIS — D631 Anemia in chronic kidney disease: Secondary | ICD-10-CM | POA: Diagnosis not present

## 2022-12-25 DIAGNOSIS — D631 Anemia in chronic kidney disease: Secondary | ICD-10-CM | POA: Diagnosis not present

## 2022-12-25 DIAGNOSIS — D509 Iron deficiency anemia, unspecified: Secondary | ICD-10-CM | POA: Diagnosis not present

## 2022-12-25 DIAGNOSIS — G4733 Obstructive sleep apnea (adult) (pediatric): Secondary | ICD-10-CM | POA: Diagnosis not present

## 2022-12-25 DIAGNOSIS — N2581 Secondary hyperparathyroidism of renal origin: Secondary | ICD-10-CM | POA: Diagnosis not present

## 2022-12-25 DIAGNOSIS — N186 End stage renal disease: Secondary | ICD-10-CM | POA: Diagnosis not present

## 2022-12-25 DIAGNOSIS — I10 Essential (primary) hypertension: Secondary | ICD-10-CM | POA: Diagnosis not present

## 2022-12-25 DIAGNOSIS — Z992 Dependence on renal dialysis: Secondary | ICD-10-CM | POA: Diagnosis not present

## 2022-12-26 DIAGNOSIS — G4733 Obstructive sleep apnea (adult) (pediatric): Secondary | ICD-10-CM | POA: Diagnosis not present

## 2022-12-26 DIAGNOSIS — I10 Essential (primary) hypertension: Secondary | ICD-10-CM | POA: Diagnosis not present

## 2022-12-27 DIAGNOSIS — I120 Hypertensive chronic kidney disease with stage 5 chronic kidney disease or end stage renal disease: Secondary | ICD-10-CM | POA: Diagnosis not present

## 2022-12-27 DIAGNOSIS — H3340 Traction detachment of retina, unspecified eye: Secondary | ICD-10-CM | POA: Diagnosis not present

## 2022-12-27 DIAGNOSIS — N186 End stage renal disease: Secondary | ICD-10-CM | POA: Diagnosis not present

## 2022-12-27 DIAGNOSIS — H547 Unspecified visual loss: Secondary | ICD-10-CM | POA: Diagnosis not present

## 2022-12-27 DIAGNOSIS — N2581 Secondary hyperparathyroidism of renal origin: Secondary | ICD-10-CM | POA: Diagnosis not present

## 2022-12-27 DIAGNOSIS — D631 Anemia in chronic kidney disease: Secondary | ICD-10-CM | POA: Diagnosis not present

## 2022-12-27 DIAGNOSIS — I959 Hypotension, unspecified: Secondary | ICD-10-CM | POA: Diagnosis not present

## 2022-12-27 DIAGNOSIS — E1122 Type 2 diabetes mellitus with diabetic chronic kidney disease: Secondary | ICD-10-CM | POA: Diagnosis not present

## 2022-12-27 DIAGNOSIS — Z880 Allergy status to penicillin: Secondary | ICD-10-CM | POA: Diagnosis not present

## 2022-12-27 DIAGNOSIS — R519 Headache, unspecified: Secondary | ICD-10-CM | POA: Diagnosis not present

## 2022-12-27 DIAGNOSIS — H538 Other visual disturbances: Secondary | ICD-10-CM | POA: Diagnosis not present

## 2022-12-27 DIAGNOSIS — H4089 Other specified glaucoma: Secondary | ICD-10-CM | POA: Diagnosis not present

## 2022-12-27 DIAGNOSIS — Z992 Dependence on renal dialysis: Secondary | ICD-10-CM | POA: Diagnosis not present

## 2022-12-27 DIAGNOSIS — D509 Iron deficiency anemia, unspecified: Secondary | ICD-10-CM | POA: Diagnosis not present

## 2022-12-27 DIAGNOSIS — H4052X3 Glaucoma secondary to other eye disorders, left eye, severe stage: Secondary | ICD-10-CM | POA: Diagnosis not present

## 2022-12-27 DIAGNOSIS — Z888 Allergy status to other drugs, medicaments and biological substances status: Secondary | ICD-10-CM | POA: Diagnosis not present

## 2022-12-30 DIAGNOSIS — N2581 Secondary hyperparathyroidism of renal origin: Secondary | ICD-10-CM | POA: Diagnosis not present

## 2022-12-30 DIAGNOSIS — N186 End stage renal disease: Secondary | ICD-10-CM | POA: Diagnosis not present

## 2022-12-30 DIAGNOSIS — D631 Anemia in chronic kidney disease: Secondary | ICD-10-CM | POA: Diagnosis not present

## 2022-12-30 DIAGNOSIS — Z992 Dependence on renal dialysis: Secondary | ICD-10-CM | POA: Diagnosis not present

## 2022-12-30 DIAGNOSIS — D509 Iron deficiency anemia, unspecified: Secondary | ICD-10-CM | POA: Diagnosis not present

## 2023-01-01 DIAGNOSIS — D509 Iron deficiency anemia, unspecified: Secondary | ICD-10-CM | POA: Diagnosis not present

## 2023-01-01 DIAGNOSIS — N186 End stage renal disease: Secondary | ICD-10-CM | POA: Diagnosis not present

## 2023-01-01 DIAGNOSIS — D631 Anemia in chronic kidney disease: Secondary | ICD-10-CM | POA: Diagnosis not present

## 2023-01-01 DIAGNOSIS — Z992 Dependence on renal dialysis: Secondary | ICD-10-CM | POA: Diagnosis not present

## 2023-01-01 DIAGNOSIS — N2581 Secondary hyperparathyroidism of renal origin: Secondary | ICD-10-CM | POA: Diagnosis not present

## 2023-01-02 DIAGNOSIS — H42 Glaucoma in diseases classified elsewhere: Secondary | ICD-10-CM | POA: Diagnosis not present

## 2023-01-02 DIAGNOSIS — E1139 Type 2 diabetes mellitus with other diabetic ophthalmic complication: Secondary | ICD-10-CM | POA: Diagnosis not present

## 2023-01-02 DIAGNOSIS — E113591 Type 2 diabetes mellitus with proliferative diabetic retinopathy without macular edema, right eye: Secondary | ICD-10-CM | POA: Diagnosis not present

## 2023-01-02 DIAGNOSIS — H04123 Dry eye syndrome of bilateral lacrimal glands: Secondary | ICD-10-CM | POA: Diagnosis not present

## 2023-01-03 DIAGNOSIS — D509 Iron deficiency anemia, unspecified: Secondary | ICD-10-CM | POA: Diagnosis not present

## 2023-01-03 DIAGNOSIS — D631 Anemia in chronic kidney disease: Secondary | ICD-10-CM | POA: Diagnosis not present

## 2023-01-03 DIAGNOSIS — N186 End stage renal disease: Secondary | ICD-10-CM | POA: Diagnosis not present

## 2023-01-03 DIAGNOSIS — Z992 Dependence on renal dialysis: Secondary | ICD-10-CM | POA: Diagnosis not present

## 2023-01-03 DIAGNOSIS — N2581 Secondary hyperparathyroidism of renal origin: Secondary | ICD-10-CM | POA: Diagnosis not present

## 2023-01-06 DIAGNOSIS — N2581 Secondary hyperparathyroidism of renal origin: Secondary | ICD-10-CM | POA: Diagnosis not present

## 2023-01-06 DIAGNOSIS — Z992 Dependence on renal dialysis: Secondary | ICD-10-CM | POA: Diagnosis not present

## 2023-01-06 DIAGNOSIS — D631 Anemia in chronic kidney disease: Secondary | ICD-10-CM | POA: Diagnosis not present

## 2023-01-06 DIAGNOSIS — D509 Iron deficiency anemia, unspecified: Secondary | ICD-10-CM | POA: Diagnosis not present

## 2023-01-06 DIAGNOSIS — N186 End stage renal disease: Secondary | ICD-10-CM | POA: Diagnosis not present

## 2023-01-08 DIAGNOSIS — N2581 Secondary hyperparathyroidism of renal origin: Secondary | ICD-10-CM | POA: Diagnosis not present

## 2023-01-08 DIAGNOSIS — D509 Iron deficiency anemia, unspecified: Secondary | ICD-10-CM | POA: Diagnosis not present

## 2023-01-08 DIAGNOSIS — N186 End stage renal disease: Secondary | ICD-10-CM | POA: Diagnosis not present

## 2023-01-08 DIAGNOSIS — D631 Anemia in chronic kidney disease: Secondary | ICD-10-CM | POA: Diagnosis not present

## 2023-01-08 DIAGNOSIS — Z992 Dependence on renal dialysis: Secondary | ICD-10-CM | POA: Diagnosis not present

## 2023-01-10 DIAGNOSIS — E1122 Type 2 diabetes mellitus with diabetic chronic kidney disease: Secondary | ICD-10-CM | POA: Diagnosis not present

## 2023-01-10 DIAGNOSIS — D509 Iron deficiency anemia, unspecified: Secondary | ICD-10-CM | POA: Diagnosis not present

## 2023-01-10 DIAGNOSIS — D631 Anemia in chronic kidney disease: Secondary | ICD-10-CM | POA: Diagnosis not present

## 2023-01-10 DIAGNOSIS — R222 Localized swelling, mass and lump, trunk: Secondary | ICD-10-CM | POA: Diagnosis not present

## 2023-01-10 DIAGNOSIS — Z992 Dependence on renal dialysis: Secondary | ICD-10-CM | POA: Diagnosis not present

## 2023-01-10 DIAGNOSIS — N3943 Post-void dribbling: Secondary | ICD-10-CM | POA: Diagnosis not present

## 2023-01-10 DIAGNOSIS — N2581 Secondary hyperparathyroidism of renal origin: Secondary | ICD-10-CM | POA: Diagnosis not present

## 2023-01-10 DIAGNOSIS — N186 End stage renal disease: Secondary | ICD-10-CM | POA: Diagnosis not present

## 2023-01-12 DIAGNOSIS — N186 End stage renal disease: Secondary | ICD-10-CM | POA: Diagnosis not present

## 2023-01-13 DIAGNOSIS — N2581 Secondary hyperparathyroidism of renal origin: Secondary | ICD-10-CM | POA: Diagnosis not present

## 2023-01-13 DIAGNOSIS — Z992 Dependence on renal dialysis: Secondary | ICD-10-CM | POA: Diagnosis not present

## 2023-01-13 DIAGNOSIS — N250 Renal osteodystrophy: Secondary | ICD-10-CM | POA: Diagnosis not present

## 2023-01-13 DIAGNOSIS — N186 End stage renal disease: Secondary | ICD-10-CM | POA: Diagnosis not present

## 2023-01-15 DIAGNOSIS — D631 Anemia in chronic kidney disease: Secondary | ICD-10-CM | POA: Diagnosis not present

## 2023-01-15 DIAGNOSIS — R079 Chest pain, unspecified: Secondary | ICD-10-CM | POA: Diagnosis not present

## 2023-01-15 DIAGNOSIS — R339 Retention of urine, unspecified: Secondary | ICD-10-CM | POA: Diagnosis not present

## 2023-01-15 DIAGNOSIS — Z4789 Encounter for other orthopedic aftercare: Secondary | ICD-10-CM | POA: Diagnosis not present

## 2023-01-15 DIAGNOSIS — H4089 Other specified glaucoma: Secondary | ICD-10-CM | POA: Diagnosis not present

## 2023-01-15 DIAGNOSIS — E113521 Type 2 diabetes mellitus with proliferative diabetic retinopathy with traction retinal detachment involving the macula, right eye: Secondary | ICD-10-CM | POA: Diagnosis not present

## 2023-01-15 DIAGNOSIS — H3321 Serous retinal detachment, right eye: Secondary | ICD-10-CM | POA: Diagnosis not present

## 2023-01-15 DIAGNOSIS — N2581 Secondary hyperparathyroidism of renal origin: Secondary | ICD-10-CM | POA: Diagnosis not present

## 2023-01-15 DIAGNOSIS — H4053X0 Glaucoma secondary to other eye disorders, bilateral, stage unspecified: Secondary | ICD-10-CM | POA: Diagnosis not present

## 2023-01-15 DIAGNOSIS — R059 Cough, unspecified: Secondary | ICD-10-CM | POA: Diagnosis not present

## 2023-01-15 DIAGNOSIS — M25561 Pain in right knee: Secondary | ICD-10-CM | POA: Diagnosis not present

## 2023-01-15 DIAGNOSIS — L299 Pruritus, unspecified: Secondary | ICD-10-CM | POA: Diagnosis not present

## 2023-01-15 DIAGNOSIS — N390 Urinary tract infection, site not specified: Secondary | ICD-10-CM | POA: Diagnosis not present

## 2023-01-15 DIAGNOSIS — I120 Hypertensive chronic kidney disease with stage 5 chronic kidney disease or end stage renal disease: Secondary | ICD-10-CM | POA: Diagnosis not present

## 2023-01-15 DIAGNOSIS — E113531 Type 2 diabetes mellitus with proliferative diabetic retinopathy with traction retinal detachment not involving the macula, right eye: Secondary | ICD-10-CM | POA: Diagnosis not present

## 2023-01-15 DIAGNOSIS — E861 Hypovolemia: Secondary | ICD-10-CM | POA: Diagnosis not present

## 2023-01-15 DIAGNOSIS — M25461 Effusion, right knee: Secondary | ICD-10-CM | POA: Diagnosis not present

## 2023-01-15 DIAGNOSIS — H16011 Central corneal ulcer, right eye: Secondary | ICD-10-CM | POA: Diagnosis not present

## 2023-01-15 DIAGNOSIS — H16001 Unspecified corneal ulcer, right eye: Secondary | ICD-10-CM | POA: Diagnosis not present

## 2023-01-15 DIAGNOSIS — E1122 Type 2 diabetes mellitus with diabetic chronic kidney disease: Secondary | ICD-10-CM | POA: Diagnosis not present

## 2023-01-15 DIAGNOSIS — E1139 Type 2 diabetes mellitus with other diabetic ophthalmic complication: Secondary | ICD-10-CM | POA: Diagnosis not present

## 2023-01-15 DIAGNOSIS — Z992 Dependence on renal dialysis: Secondary | ICD-10-CM | POA: Diagnosis not present

## 2023-01-15 DIAGNOSIS — E119 Type 2 diabetes mellitus without complications: Secondary | ICD-10-CM | POA: Diagnosis not present

## 2023-01-15 DIAGNOSIS — D649 Anemia, unspecified: Secondary | ICD-10-CM | POA: Diagnosis not present

## 2023-01-15 DIAGNOSIS — R531 Weakness: Secondary | ICD-10-CM | POA: Diagnosis not present

## 2023-01-15 DIAGNOSIS — E11649 Type 2 diabetes mellitus with hypoglycemia without coma: Secondary | ICD-10-CM | POA: Diagnosis not present

## 2023-01-15 DIAGNOSIS — H5710 Ocular pain, unspecified eye: Secondary | ICD-10-CM | POA: Diagnosis not present

## 2023-01-15 DIAGNOSIS — K5900 Constipation, unspecified: Secondary | ICD-10-CM | POA: Diagnosis not present

## 2023-01-15 DIAGNOSIS — H3341 Traction detachment of retina, right eye: Secondary | ICD-10-CM | POA: Diagnosis not present

## 2023-01-15 DIAGNOSIS — H539 Unspecified visual disturbance: Secondary | ICD-10-CM | POA: Diagnosis not present

## 2023-01-15 DIAGNOSIS — G8911 Acute pain due to trauma: Secondary | ICD-10-CM | POA: Diagnosis not present

## 2023-01-15 DIAGNOSIS — I10 Essential (primary) hypertension: Secondary | ICD-10-CM | POA: Diagnosis not present

## 2023-01-15 DIAGNOSIS — H5711 Ocular pain, right eye: Secondary | ICD-10-CM | POA: Diagnosis not present

## 2023-01-15 DIAGNOSIS — I129 Hypertensive chronic kidney disease with stage 1 through stage 4 chronic kidney disease, or unspecified chronic kidney disease: Secondary | ICD-10-CM | POA: Diagnosis not present

## 2023-01-15 DIAGNOSIS — N186 End stage renal disease: Secondary | ICD-10-CM | POA: Diagnosis not present

## 2023-01-15 DIAGNOSIS — G4733 Obstructive sleep apnea (adult) (pediatric): Secondary | ICD-10-CM | POA: Diagnosis not present

## 2023-01-15 DIAGNOSIS — L89152 Pressure ulcer of sacral region, stage 2: Secondary | ICD-10-CM | POA: Diagnosis not present

## 2023-01-15 DIAGNOSIS — I953 Hypotension of hemodialysis: Secondary | ICD-10-CM | POA: Diagnosis not present

## 2023-01-15 DIAGNOSIS — S86811A Strain of other muscle(s) and tendon(s) at lower leg level, right leg, initial encounter: Secondary | ICD-10-CM | POA: Diagnosis not present

## 2023-01-15 DIAGNOSIS — Z6841 Body Mass Index (BMI) 40.0 and over, adult: Secondary | ICD-10-CM | POA: Diagnosis not present

## 2023-01-15 DIAGNOSIS — H179 Unspecified corneal scar and opacity: Secondary | ICD-10-CM | POA: Diagnosis not present

## 2023-01-15 DIAGNOSIS — F32A Depression, unspecified: Secondary | ICD-10-CM | POA: Diagnosis not present

## 2023-01-15 DIAGNOSIS — M6250 Muscle wasting and atrophy, not elsewhere classified, unspecified site: Secondary | ICD-10-CM | POA: Diagnosis not present

## 2023-01-15 DIAGNOSIS — E211 Secondary hyperparathyroidism, not elsewhere classified: Secondary | ICD-10-CM | POA: Diagnosis not present

## 2023-02-25 DIAGNOSIS — Z6841 Body Mass Index (BMI) 40.0 and over, adult: Secondary | ICD-10-CM | POA: Diagnosis not present

## 2023-02-25 DIAGNOSIS — I120 Hypertensive chronic kidney disease with stage 5 chronic kidney disease or end stage renal disease: Secondary | ICD-10-CM | POA: Diagnosis not present

## 2023-02-25 DIAGNOSIS — S3993XA Unspecified injury of pelvis, initial encounter: Secondary | ICD-10-CM | POA: Diagnosis not present

## 2023-02-25 DIAGNOSIS — Z992 Dependence on renal dialysis: Secondary | ICD-10-CM | POA: Diagnosis not present

## 2023-02-25 DIAGNOSIS — H4053X0 Glaucoma secondary to other eye disorders, bilateral, stage unspecified: Secondary | ICD-10-CM | POA: Diagnosis not present

## 2023-02-25 DIAGNOSIS — M25511 Pain in right shoulder: Secondary | ICD-10-CM | POA: Diagnosis not present

## 2023-02-25 DIAGNOSIS — D509 Iron deficiency anemia, unspecified: Secondary | ICD-10-CM | POA: Diagnosis not present

## 2023-02-25 DIAGNOSIS — Z4789 Encounter for other orthopedic aftercare: Secondary | ICD-10-CM | POA: Diagnosis not present

## 2023-02-25 DIAGNOSIS — K625 Hemorrhage of anus and rectum: Secondary | ICD-10-CM | POA: Diagnosis not present

## 2023-02-25 DIAGNOSIS — M25551 Pain in right hip: Secondary | ICD-10-CM | POA: Diagnosis not present

## 2023-02-25 DIAGNOSIS — S299XXA Unspecified injury of thorax, initial encounter: Secondary | ICD-10-CM | POA: Diagnosis not present

## 2023-02-25 DIAGNOSIS — F32A Depression, unspecified: Secondary | ICD-10-CM | POA: Diagnosis not present

## 2023-02-25 DIAGNOSIS — S0990XA Unspecified injury of head, initial encounter: Secondary | ICD-10-CM | POA: Diagnosis not present

## 2023-02-25 DIAGNOSIS — S199XXA Unspecified injury of neck, initial encounter: Secondary | ICD-10-CM | POA: Diagnosis not present

## 2023-02-25 DIAGNOSIS — N2581 Secondary hyperparathyroidism of renal origin: Secondary | ICD-10-CM | POA: Diagnosis not present

## 2023-02-25 DIAGNOSIS — L299 Pruritus, unspecified: Secondary | ICD-10-CM | POA: Diagnosis not present

## 2023-02-25 DIAGNOSIS — D62 Acute posthemorrhagic anemia: Secondary | ICD-10-CM | POA: Diagnosis not present

## 2023-02-25 DIAGNOSIS — R531 Weakness: Secondary | ICD-10-CM | POA: Diagnosis not present

## 2023-02-25 DIAGNOSIS — R5381 Other malaise: Secondary | ICD-10-CM | POA: Diagnosis not present

## 2023-02-25 DIAGNOSIS — G8911 Acute pain due to trauma: Secondary | ICD-10-CM | POA: Diagnosis not present

## 2023-02-25 DIAGNOSIS — E1122 Type 2 diabetes mellitus with diabetic chronic kidney disease: Secondary | ICD-10-CM | POA: Diagnosis not present

## 2023-02-25 DIAGNOSIS — H16001 Unspecified corneal ulcer, right eye: Secondary | ICD-10-CM | POA: Diagnosis not present

## 2023-02-25 DIAGNOSIS — M25572 Pain in left ankle and joints of left foot: Secondary | ICD-10-CM | POA: Diagnosis not present

## 2023-02-25 DIAGNOSIS — R339 Retention of urine, unspecified: Secondary | ICD-10-CM | POA: Diagnosis not present

## 2023-02-25 DIAGNOSIS — S4991XA Unspecified injury of right shoulder and upper arm, initial encounter: Secondary | ICD-10-CM | POA: Diagnosis not present

## 2023-02-25 DIAGNOSIS — R404 Transient alteration of awareness: Secondary | ICD-10-CM | POA: Diagnosis not present

## 2023-02-25 DIAGNOSIS — K5900 Constipation, unspecified: Secondary | ICD-10-CM | POA: Diagnosis not present

## 2023-02-25 DIAGNOSIS — N186 End stage renal disease: Secondary | ICD-10-CM | POA: Diagnosis not present

## 2023-02-25 DIAGNOSIS — E875 Hyperkalemia: Secondary | ICD-10-CM | POA: Diagnosis not present

## 2023-02-25 DIAGNOSIS — G4733 Obstructive sleep apnea (adult) (pediatric): Secondary | ICD-10-CM | POA: Diagnosis not present

## 2023-02-25 DIAGNOSIS — J811 Chronic pulmonary edema: Secondary | ICD-10-CM | POA: Diagnosis not present

## 2023-02-25 DIAGNOSIS — W19XXXA Unspecified fall, initial encounter: Secondary | ICD-10-CM | POA: Diagnosis not present

## 2023-02-25 DIAGNOSIS — N250 Renal osteodystrophy: Secondary | ICD-10-CM | POA: Diagnosis not present

## 2023-02-25 DIAGNOSIS — M6250 Muscle wasting and atrophy, not elsewhere classified, unspecified site: Secondary | ICD-10-CM | POA: Diagnosis not present

## 2023-02-25 DIAGNOSIS — M79604 Pain in right leg: Secondary | ICD-10-CM | POA: Diagnosis not present

## 2023-02-25 DIAGNOSIS — E11649 Type 2 diabetes mellitus with hypoglycemia without coma: Secondary | ICD-10-CM | POA: Diagnosis not present

## 2023-02-26 DIAGNOSIS — R5381 Other malaise: Secondary | ICD-10-CM | POA: Diagnosis not present

## 2023-02-27 DIAGNOSIS — D631 Anemia in chronic kidney disease: Secondary | ICD-10-CM | POA: Diagnosis not present

## 2023-02-27 DIAGNOSIS — R1110 Vomiting, unspecified: Secondary | ICD-10-CM | POA: Diagnosis not present

## 2023-02-27 DIAGNOSIS — K6289 Other specified diseases of anus and rectum: Secondary | ICD-10-CM | POA: Diagnosis not present

## 2023-02-27 DIAGNOSIS — I120 Hypertensive chronic kidney disease with stage 5 chronic kidney disease or end stage renal disease: Secondary | ICD-10-CM | POA: Diagnosis not present

## 2023-02-27 DIAGNOSIS — E1122 Type 2 diabetes mellitus with diabetic chronic kidney disease: Secondary | ICD-10-CM | POA: Diagnosis not present

## 2023-02-27 DIAGNOSIS — G8911 Acute pain due to trauma: Secondary | ICD-10-CM | POA: Diagnosis not present

## 2023-02-27 DIAGNOSIS — M6281 Muscle weakness (generalized): Secondary | ICD-10-CM | POA: Diagnosis not present

## 2023-02-27 DIAGNOSIS — Z7401 Bed confinement status: Secondary | ICD-10-CM | POA: Diagnosis not present

## 2023-02-27 DIAGNOSIS — X58XXXD Exposure to other specified factors, subsequent encounter: Secondary | ICD-10-CM | POA: Diagnosis not present

## 2023-02-27 DIAGNOSIS — K625 Hemorrhage of anus and rectum: Secondary | ICD-10-CM | POA: Diagnosis not present

## 2023-02-27 DIAGNOSIS — M25551 Pain in right hip: Secondary | ICD-10-CM | POA: Diagnosis not present

## 2023-02-27 DIAGNOSIS — N250 Renal osteodystrophy: Secondary | ICD-10-CM | POA: Diagnosis not present

## 2023-02-27 DIAGNOSIS — S299XXA Unspecified injury of thorax, initial encounter: Secondary | ICD-10-CM | POA: Diagnosis not present

## 2023-02-27 DIAGNOSIS — S199XXA Unspecified injury of neck, initial encounter: Secondary | ICD-10-CM | POA: Diagnosis not present

## 2023-02-27 DIAGNOSIS — R531 Weakness: Secondary | ICD-10-CM | POA: Diagnosis not present

## 2023-02-27 DIAGNOSIS — S4991XA Unspecified injury of right shoulder and upper arm, initial encounter: Secondary | ICD-10-CM | POA: Diagnosis not present

## 2023-02-27 DIAGNOSIS — M79604 Pain in right leg: Secondary | ICD-10-CM | POA: Diagnosis not present

## 2023-02-27 DIAGNOSIS — J811 Chronic pulmonary edema: Secondary | ICD-10-CM | POA: Diagnosis not present

## 2023-02-27 DIAGNOSIS — S3993XA Unspecified injury of pelvis, initial encounter: Secondary | ICD-10-CM | POA: Diagnosis not present

## 2023-02-27 DIAGNOSIS — M25511 Pain in right shoulder: Secondary | ICD-10-CM | POA: Diagnosis not present

## 2023-02-27 DIAGNOSIS — S0990XA Unspecified injury of head, initial encounter: Secondary | ICD-10-CM | POA: Diagnosis not present

## 2023-02-27 DIAGNOSIS — K921 Melena: Secondary | ICD-10-CM | POA: Diagnosis not present

## 2023-02-27 DIAGNOSIS — D62 Acute posthemorrhagic anemia: Secondary | ICD-10-CM | POA: Diagnosis not present

## 2023-02-27 DIAGNOSIS — N186 End stage renal disease: Secondary | ICD-10-CM | POA: Diagnosis not present

## 2023-02-27 DIAGNOSIS — K922 Gastrointestinal hemorrhage, unspecified: Secondary | ICD-10-CM | POA: Diagnosis not present

## 2023-02-27 DIAGNOSIS — Z992 Dependence on renal dialysis: Secondary | ICD-10-CM | POA: Diagnosis not present

## 2023-02-27 DIAGNOSIS — Z6841 Body Mass Index (BMI) 40.0 and over, adult: Secondary | ICD-10-CM | POA: Diagnosis not present

## 2023-02-27 DIAGNOSIS — M6250 Muscle wasting and atrophy, not elsewhere classified, unspecified site: Secondary | ICD-10-CM | POA: Diagnosis not present

## 2023-02-27 DIAGNOSIS — S86811D Strain of other muscle(s) and tendon(s) at lower leg level, right leg, subsequent encounter: Secondary | ICD-10-CM | POA: Diagnosis not present

## 2023-02-27 DIAGNOSIS — K5900 Constipation, unspecified: Secondary | ICD-10-CM | POA: Diagnosis not present

## 2023-02-27 DIAGNOSIS — I129 Hypertensive chronic kidney disease with stage 1 through stage 4 chronic kidney disease, or unspecified chronic kidney disease: Secondary | ICD-10-CM | POA: Diagnosis not present

## 2023-02-27 DIAGNOSIS — D509 Iron deficiency anemia, unspecified: Secondary | ICD-10-CM | POA: Diagnosis not present

## 2023-02-27 DIAGNOSIS — M25572 Pain in left ankle and joints of left foot: Secondary | ICD-10-CM | POA: Diagnosis not present

## 2023-02-27 DIAGNOSIS — E11649 Type 2 diabetes mellitus with hypoglycemia without coma: Secondary | ICD-10-CM | POA: Diagnosis not present

## 2023-02-27 DIAGNOSIS — I10 Essential (primary) hypertension: Secondary | ICD-10-CM | POA: Diagnosis not present

## 2023-02-27 DIAGNOSIS — W19XXXA Unspecified fall, initial encounter: Secondary | ICD-10-CM | POA: Diagnosis not present

## 2023-02-27 DIAGNOSIS — R2689 Other abnormalities of gait and mobility: Secondary | ICD-10-CM | POA: Diagnosis not present

## 2023-02-27 DIAGNOSIS — G4733 Obstructive sleep apnea (adult) (pediatric): Secondary | ICD-10-CM | POA: Diagnosis not present

## 2023-02-27 DIAGNOSIS — E875 Hyperkalemia: Secondary | ICD-10-CM | POA: Diagnosis not present

## 2023-02-27 DIAGNOSIS — R404 Transient alteration of awareness: Secondary | ICD-10-CM | POA: Diagnosis not present

## 2023-03-01 DIAGNOSIS — E875 Hyperkalemia: Secondary | ICD-10-CM | POA: Diagnosis not present

## 2023-03-01 DIAGNOSIS — K625 Hemorrhage of anus and rectum: Secondary | ICD-10-CM | POA: Diagnosis not present

## 2023-03-01 DIAGNOSIS — M25551 Pain in right hip: Secondary | ICD-10-CM | POA: Diagnosis not present

## 2023-03-01 DIAGNOSIS — N186 End stage renal disease: Secondary | ICD-10-CM | POA: Diagnosis not present

## 2023-03-02 DIAGNOSIS — N186 End stage renal disease: Secondary | ICD-10-CM | POA: Diagnosis not present

## 2023-03-02 DIAGNOSIS — E875 Hyperkalemia: Secondary | ICD-10-CM | POA: Diagnosis not present

## 2023-03-02 DIAGNOSIS — M25551 Pain in right hip: Secondary | ICD-10-CM | POA: Diagnosis not present

## 2023-03-02 DIAGNOSIS — K625 Hemorrhage of anus and rectum: Secondary | ICD-10-CM | POA: Diagnosis not present

## 2023-03-03 DIAGNOSIS — E875 Hyperkalemia: Secondary | ICD-10-CM | POA: Diagnosis not present

## 2023-03-03 DIAGNOSIS — M25551 Pain in right hip: Secondary | ICD-10-CM | POA: Diagnosis not present

## 2023-03-03 DIAGNOSIS — K625 Hemorrhage of anus and rectum: Secondary | ICD-10-CM | POA: Diagnosis not present

## 2023-03-03 DIAGNOSIS — N186 End stage renal disease: Secondary | ICD-10-CM | POA: Diagnosis not present

## 2023-03-04 DIAGNOSIS — S86811D Strain of other muscle(s) and tendon(s) at lower leg level, right leg, subsequent encounter: Secondary | ICD-10-CM | POA: Diagnosis not present

## 2023-03-04 DIAGNOSIS — K625 Hemorrhage of anus and rectum: Secondary | ICD-10-CM | POA: Diagnosis not present

## 2023-03-05 DIAGNOSIS — S86811D Strain of other muscle(s) and tendon(s) at lower leg level, right leg, subsequent encounter: Secondary | ICD-10-CM | POA: Diagnosis not present

## 2023-03-05 DIAGNOSIS — K625 Hemorrhage of anus and rectum: Secondary | ICD-10-CM | POA: Diagnosis not present

## 2023-03-06 DIAGNOSIS — E1122 Type 2 diabetes mellitus with diabetic chronic kidney disease: Secondary | ICD-10-CM | POA: Diagnosis not present

## 2023-03-06 DIAGNOSIS — Z992 Dependence on renal dialysis: Secondary | ICD-10-CM | POA: Diagnosis not present

## 2023-03-06 DIAGNOSIS — N186 End stage renal disease: Secondary | ICD-10-CM | POA: Diagnosis not present

## 2023-03-06 DIAGNOSIS — I120 Hypertensive chronic kidney disease with stage 5 chronic kidney disease or end stage renal disease: Secondary | ICD-10-CM | POA: Diagnosis not present

## 2023-03-06 DIAGNOSIS — K625 Hemorrhage of anus and rectum: Secondary | ICD-10-CM | POA: Diagnosis not present

## 2023-03-06 DIAGNOSIS — S86811D Strain of other muscle(s) and tendon(s) at lower leg level, right leg, subsequent encounter: Secondary | ICD-10-CM | POA: Diagnosis not present

## 2023-03-07 DIAGNOSIS — S86811D Strain of other muscle(s) and tendon(s) at lower leg level, right leg, subsequent encounter: Secondary | ICD-10-CM | POA: Diagnosis not present

## 2023-03-07 DIAGNOSIS — K625 Hemorrhage of anus and rectum: Secondary | ICD-10-CM | POA: Diagnosis not present

## 2023-03-15 DIAGNOSIS — K625 Hemorrhage of anus and rectum: Secondary | ICD-10-CM | POA: Diagnosis not present

## 2023-03-15 DIAGNOSIS — S86811D Strain of other muscle(s) and tendon(s) at lower leg level, right leg, subsequent encounter: Secondary | ICD-10-CM | POA: Diagnosis not present

## 2023-03-16 DIAGNOSIS — K625 Hemorrhage of anus and rectum: Secondary | ICD-10-CM | POA: Diagnosis not present

## 2023-03-16 DIAGNOSIS — S86811D Strain of other muscle(s) and tendon(s) at lower leg level, right leg, subsequent encounter: Secondary | ICD-10-CM | POA: Diagnosis not present

## 2023-03-17 DIAGNOSIS — K625 Hemorrhage of anus and rectum: Secondary | ICD-10-CM | POA: Diagnosis not present

## 2023-03-17 DIAGNOSIS — S86811D Strain of other muscle(s) and tendon(s) at lower leg level, right leg, subsequent encounter: Secondary | ICD-10-CM | POA: Diagnosis not present

## 2023-03-24 DIAGNOSIS — Z992 Dependence on renal dialysis: Secondary | ICD-10-CM | POA: Diagnosis not present

## 2023-03-24 DIAGNOSIS — N250 Renal osteodystrophy: Secondary | ICD-10-CM | POA: Diagnosis not present

## 2023-03-24 DIAGNOSIS — G4733 Obstructive sleep apnea (adult) (pediatric): Secondary | ICD-10-CM | POA: Diagnosis not present

## 2023-03-24 DIAGNOSIS — Z6841 Body Mass Index (BMI) 40.0 and over, adult: Secondary | ICD-10-CM | POA: Diagnosis not present

## 2023-03-24 DIAGNOSIS — I10 Essential (primary) hypertension: Secondary | ICD-10-CM | POA: Diagnosis not present

## 2023-03-24 DIAGNOSIS — Z7401 Bed confinement status: Secondary | ICD-10-CM | POA: Diagnosis not present

## 2023-03-24 DIAGNOSIS — S8991XA Unspecified injury of right lower leg, initial encounter: Secondary | ICD-10-CM | POA: Diagnosis not present

## 2023-03-24 DIAGNOSIS — J811 Chronic pulmonary edema: Secondary | ICD-10-CM | POA: Diagnosis not present

## 2023-03-24 DIAGNOSIS — E875 Hyperkalemia: Secondary | ICD-10-CM | POA: Diagnosis not present

## 2023-03-24 DIAGNOSIS — E1122 Type 2 diabetes mellitus with diabetic chronic kidney disease: Secondary | ICD-10-CM | POA: Diagnosis not present

## 2023-03-24 DIAGNOSIS — S21011A Laceration without foreign body of right breast, initial encounter: Secondary | ICD-10-CM | POA: Diagnosis not present

## 2023-03-24 DIAGNOSIS — L98429 Non-pressure chronic ulcer of back with unspecified severity: Secondary | ICD-10-CM | POA: Diagnosis not present

## 2023-03-24 DIAGNOSIS — S86811D Strain of other muscle(s) and tendon(s) at lower leg level, right leg, subsequent encounter: Secondary | ICD-10-CM | POA: Diagnosis not present

## 2023-03-24 DIAGNOSIS — M6281 Muscle weakness (generalized): Secondary | ICD-10-CM | POA: Diagnosis not present

## 2023-03-24 DIAGNOSIS — S20111D Abrasion of breast, right breast, subsequent encounter: Secondary | ICD-10-CM | POA: Diagnosis not present

## 2023-03-24 DIAGNOSIS — I517 Cardiomegaly: Secondary | ICD-10-CM | POA: Diagnosis not present

## 2023-03-24 DIAGNOSIS — M1711 Unilateral primary osteoarthritis, right knee: Secondary | ICD-10-CM | POA: Diagnosis not present

## 2023-03-24 DIAGNOSIS — M25461 Effusion, right knee: Secondary | ICD-10-CM | POA: Diagnosis not present

## 2023-03-24 DIAGNOSIS — N186 End stage renal disease: Secondary | ICD-10-CM | POA: Diagnosis not present

## 2023-03-24 DIAGNOSIS — D509 Iron deficiency anemia, unspecified: Secondary | ICD-10-CM | POA: Diagnosis not present

## 2023-03-24 DIAGNOSIS — H3589 Other specified retinal disorders: Secondary | ICD-10-CM | POA: Diagnosis not present

## 2023-03-24 DIAGNOSIS — M6250 Muscle wasting and atrophy, not elsewhere classified, unspecified site: Secondary | ICD-10-CM | POA: Diagnosis not present

## 2023-03-24 DIAGNOSIS — R5381 Other malaise: Secondary | ICD-10-CM | POA: Diagnosis not present

## 2023-03-24 DIAGNOSIS — G8911 Acute pain due to trauma: Secondary | ICD-10-CM | POA: Diagnosis not present

## 2023-03-24 DIAGNOSIS — M1611 Unilateral primary osteoarthritis, right hip: Secondary | ICD-10-CM | POA: Diagnosis not present

## 2023-03-24 DIAGNOSIS — R2689 Other abnormalities of gait and mobility: Secondary | ICD-10-CM | POA: Diagnosis not present

## 2023-03-24 DIAGNOSIS — Z794 Long term (current) use of insulin: Secondary | ICD-10-CM | POA: Diagnosis not present

## 2023-03-24 DIAGNOSIS — E113591 Type 2 diabetes mellitus with proliferative diabetic retinopathy without macular edema, right eye: Secondary | ICD-10-CM | POA: Diagnosis not present

## 2023-03-24 DIAGNOSIS — H3320 Serous retinal detachment, unspecified eye: Secondary | ICD-10-CM | POA: Diagnosis not present

## 2023-03-24 DIAGNOSIS — K922 Gastrointestinal hemorrhage, unspecified: Secondary | ICD-10-CM | POA: Diagnosis not present

## 2023-03-24 DIAGNOSIS — X58XXXD Exposure to other specified factors, subsequent encounter: Secondary | ICD-10-CM | POA: Diagnosis not present

## 2023-03-24 DIAGNOSIS — R531 Weakness: Secondary | ICD-10-CM | POA: Diagnosis not present

## 2023-03-25 DIAGNOSIS — Z992 Dependence on renal dialysis: Secondary | ICD-10-CM | POA: Diagnosis not present

## 2023-03-25 DIAGNOSIS — N186 End stage renal disease: Secondary | ICD-10-CM | POA: Diagnosis not present

## 2023-03-26 DIAGNOSIS — M25461 Effusion, right knee: Secondary | ICD-10-CM | POA: Diagnosis not present

## 2023-03-26 DIAGNOSIS — S86811D Strain of other muscle(s) and tendon(s) at lower leg level, right leg, subsequent encounter: Secondary | ICD-10-CM | POA: Diagnosis not present

## 2023-03-26 DIAGNOSIS — R5381 Other malaise: Secondary | ICD-10-CM | POA: Diagnosis not present

## 2023-03-26 DIAGNOSIS — S8991XA Unspecified injury of right lower leg, initial encounter: Secondary | ICD-10-CM | POA: Diagnosis not present

## 2023-03-26 DIAGNOSIS — X58XXXD Exposure to other specified factors, subsequent encounter: Secondary | ICD-10-CM | POA: Diagnosis not present

## 2023-03-27 DIAGNOSIS — Z992 Dependence on renal dialysis: Secondary | ICD-10-CM | POA: Diagnosis not present

## 2023-03-27 DIAGNOSIS — N186 End stage renal disease: Secondary | ICD-10-CM | POA: Diagnosis not present

## 2023-03-28 DIAGNOSIS — R5381 Other malaise: Secondary | ICD-10-CM | POA: Diagnosis not present

## 2023-03-29 DIAGNOSIS — N186 End stage renal disease: Secondary | ICD-10-CM | POA: Diagnosis not present

## 2023-03-29 DIAGNOSIS — Z992 Dependence on renal dialysis: Secondary | ICD-10-CM | POA: Diagnosis not present

## 2023-04-01 DIAGNOSIS — Z992 Dependence on renal dialysis: Secondary | ICD-10-CM | POA: Diagnosis not present

## 2023-04-01 DIAGNOSIS — N186 End stage renal disease: Secondary | ICD-10-CM | POA: Diagnosis not present

## 2023-04-02 DIAGNOSIS — R5381 Other malaise: Secondary | ICD-10-CM | POA: Diagnosis not present

## 2023-04-03 DIAGNOSIS — Z992 Dependence on renal dialysis: Secondary | ICD-10-CM | POA: Diagnosis not present

## 2023-04-03 DIAGNOSIS — N186 End stage renal disease: Secondary | ICD-10-CM | POA: Diagnosis not present

## 2023-04-04 DIAGNOSIS — R5381 Other malaise: Secondary | ICD-10-CM | POA: Diagnosis not present

## 2023-04-05 DIAGNOSIS — Z992 Dependence on renal dialysis: Secondary | ICD-10-CM | POA: Diagnosis not present

## 2023-04-05 DIAGNOSIS — N186 End stage renal disease: Secondary | ICD-10-CM | POA: Diagnosis not present

## 2023-04-08 DIAGNOSIS — Z992 Dependence on renal dialysis: Secondary | ICD-10-CM | POA: Diagnosis not present

## 2023-04-08 DIAGNOSIS — N186 End stage renal disease: Secondary | ICD-10-CM | POA: Diagnosis not present

## 2023-04-09 DIAGNOSIS — R5381 Other malaise: Secondary | ICD-10-CM | POA: Diagnosis not present

## 2023-04-10 DIAGNOSIS — N186 End stage renal disease: Secondary | ICD-10-CM | POA: Diagnosis not present

## 2023-04-10 DIAGNOSIS — Z992 Dependence on renal dialysis: Secondary | ICD-10-CM | POA: Diagnosis not present

## 2023-04-11 DIAGNOSIS — S21011A Laceration without foreign body of right breast, initial encounter: Secondary | ICD-10-CM | POA: Diagnosis not present

## 2023-04-11 DIAGNOSIS — N186 End stage renal disease: Secondary | ICD-10-CM | POA: Diagnosis not present

## 2023-04-11 DIAGNOSIS — R5381 Other malaise: Secondary | ICD-10-CM | POA: Diagnosis not present

## 2023-04-11 DIAGNOSIS — Z6841 Body Mass Index (BMI) 40.0 and over, adult: Secondary | ICD-10-CM | POA: Diagnosis not present

## 2023-04-11 DIAGNOSIS — E1122 Type 2 diabetes mellitus with diabetic chronic kidney disease: Secondary | ICD-10-CM | POA: Diagnosis not present

## 2023-04-12 DIAGNOSIS — Z992 Dependence on renal dialysis: Secondary | ICD-10-CM | POA: Diagnosis not present

## 2023-04-12 DIAGNOSIS — N186 End stage renal disease: Secondary | ICD-10-CM | POA: Diagnosis not present

## 2023-04-13 DIAGNOSIS — N186 End stage renal disease: Secondary | ICD-10-CM | POA: Diagnosis not present

## 2023-04-14 DIAGNOSIS — R5381 Other malaise: Secondary | ICD-10-CM | POA: Diagnosis not present

## 2023-04-15 DIAGNOSIS — Z992 Dependence on renal dialysis: Secondary | ICD-10-CM | POA: Diagnosis not present

## 2023-04-15 DIAGNOSIS — N186 End stage renal disease: Secondary | ICD-10-CM | POA: Diagnosis not present

## 2023-04-17 DIAGNOSIS — Z992 Dependence on renal dialysis: Secondary | ICD-10-CM | POA: Diagnosis not present

## 2023-04-17 DIAGNOSIS — N186 End stage renal disease: Secondary | ICD-10-CM | POA: Diagnosis not present

## 2023-04-18 DIAGNOSIS — R5381 Other malaise: Secondary | ICD-10-CM | POA: Diagnosis not present

## 2023-04-18 DIAGNOSIS — E1122 Type 2 diabetes mellitus with diabetic chronic kidney disease: Secondary | ICD-10-CM | POA: Diagnosis not present

## 2023-04-18 DIAGNOSIS — N186 End stage renal disease: Secondary | ICD-10-CM | POA: Diagnosis not present

## 2023-04-18 DIAGNOSIS — S20111D Abrasion of breast, right breast, subsequent encounter: Secondary | ICD-10-CM | POA: Diagnosis not present

## 2023-04-18 DIAGNOSIS — Z6841 Body Mass Index (BMI) 40.0 and over, adult: Secondary | ICD-10-CM | POA: Diagnosis not present

## 2023-04-19 DIAGNOSIS — Z992 Dependence on renal dialysis: Secondary | ICD-10-CM | POA: Diagnosis not present

## 2023-04-19 DIAGNOSIS — N186 End stage renal disease: Secondary | ICD-10-CM | POA: Diagnosis not present

## 2023-04-22 DIAGNOSIS — N186 End stage renal disease: Secondary | ICD-10-CM | POA: Diagnosis not present

## 2023-04-22 DIAGNOSIS — Z992 Dependence on renal dialysis: Secondary | ICD-10-CM | POA: Diagnosis not present

## 2023-04-23 DIAGNOSIS — R5381 Other malaise: Secondary | ICD-10-CM | POA: Diagnosis not present

## 2023-04-24 DIAGNOSIS — Z992 Dependence on renal dialysis: Secondary | ICD-10-CM | POA: Diagnosis not present

## 2023-04-24 DIAGNOSIS — N186 End stage renal disease: Secondary | ICD-10-CM | POA: Diagnosis not present

## 2023-04-25 DIAGNOSIS — R5381 Other malaise: Secondary | ICD-10-CM | POA: Diagnosis not present

## 2023-04-26 DIAGNOSIS — N186 End stage renal disease: Secondary | ICD-10-CM | POA: Diagnosis not present

## 2023-04-26 DIAGNOSIS — Z992 Dependence on renal dialysis: Secondary | ICD-10-CM | POA: Diagnosis not present

## 2023-04-28 DIAGNOSIS — R5381 Other malaise: Secondary | ICD-10-CM | POA: Diagnosis not present

## 2023-04-29 DIAGNOSIS — Z992 Dependence on renal dialysis: Secondary | ICD-10-CM | POA: Diagnosis not present

## 2023-04-29 DIAGNOSIS — N186 End stage renal disease: Secondary | ICD-10-CM | POA: Diagnosis not present

## 2023-05-01 DIAGNOSIS — N186 End stage renal disease: Secondary | ICD-10-CM | POA: Diagnosis not present

## 2023-05-01 DIAGNOSIS — Z992 Dependence on renal dialysis: Secondary | ICD-10-CM | POA: Diagnosis not present

## 2023-05-03 DIAGNOSIS — Z992 Dependence on renal dialysis: Secondary | ICD-10-CM | POA: Diagnosis not present

## 2023-05-03 DIAGNOSIS — N186 End stage renal disease: Secondary | ICD-10-CM | POA: Diagnosis not present

## 2023-05-06 DIAGNOSIS — N186 End stage renal disease: Secondary | ICD-10-CM | POA: Diagnosis not present

## 2023-05-06 DIAGNOSIS — Z992 Dependence on renal dialysis: Secondary | ICD-10-CM | POA: Diagnosis not present

## 2023-05-07 DIAGNOSIS — R5381 Other malaise: Secondary | ICD-10-CM | POA: Diagnosis not present

## 2023-05-08 DIAGNOSIS — N186 End stage renal disease: Secondary | ICD-10-CM | POA: Diagnosis not present

## 2023-05-08 DIAGNOSIS — Z992 Dependence on renal dialysis: Secondary | ICD-10-CM | POA: Diagnosis not present

## 2023-05-09 DIAGNOSIS — N186 End stage renal disease: Secondary | ICD-10-CM | POA: Diagnosis not present

## 2023-05-09 DIAGNOSIS — L98429 Non-pressure chronic ulcer of back with unspecified severity: Secondary | ICD-10-CM | POA: Diagnosis not present

## 2023-05-09 DIAGNOSIS — Z6841 Body Mass Index (BMI) 40.0 and over, adult: Secondary | ICD-10-CM | POA: Diagnosis not present

## 2023-05-09 DIAGNOSIS — E1122 Type 2 diabetes mellitus with diabetic chronic kidney disease: Secondary | ICD-10-CM | POA: Diagnosis not present

## 2023-05-10 DIAGNOSIS — N186 End stage renal disease: Secondary | ICD-10-CM | POA: Diagnosis not present

## 2023-05-10 DIAGNOSIS — Z992 Dependence on renal dialysis: Secondary | ICD-10-CM | POA: Diagnosis not present

## 2023-05-13 DIAGNOSIS — N186 End stage renal disease: Secondary | ICD-10-CM | POA: Diagnosis not present

## 2023-05-13 DIAGNOSIS — Z992 Dependence on renal dialysis: Secondary | ICD-10-CM | POA: Diagnosis not present

## 2023-05-14 DIAGNOSIS — N186 End stage renal disease: Secondary | ICD-10-CM | POA: Diagnosis not present

## 2023-05-14 DIAGNOSIS — R5381 Other malaise: Secondary | ICD-10-CM | POA: Diagnosis not present

## 2023-05-15 DIAGNOSIS — I10 Essential (primary) hypertension: Secondary | ICD-10-CM | POA: Diagnosis not present

## 2023-05-15 DIAGNOSIS — G4733 Obstructive sleep apnea (adult) (pediatric): Secondary | ICD-10-CM | POA: Diagnosis not present

## 2023-05-15 DIAGNOSIS — Z992 Dependence on renal dialysis: Secondary | ICD-10-CM | POA: Diagnosis not present

## 2023-05-15 DIAGNOSIS — N186 End stage renal disease: Secondary | ICD-10-CM | POA: Diagnosis not present

## 2023-05-16 DIAGNOSIS — R5381 Other malaise: Secondary | ICD-10-CM | POA: Diagnosis not present

## 2023-05-17 DIAGNOSIS — Z992 Dependence on renal dialysis: Secondary | ICD-10-CM | POA: Diagnosis not present

## 2023-05-17 DIAGNOSIS — N186 End stage renal disease: Secondary | ICD-10-CM | POA: Diagnosis not present

## 2023-05-19 DIAGNOSIS — R5381 Other malaise: Secondary | ICD-10-CM | POA: Diagnosis not present

## 2023-05-20 DIAGNOSIS — N186 End stage renal disease: Secondary | ICD-10-CM | POA: Diagnosis not present

## 2023-05-20 DIAGNOSIS — N250 Renal osteodystrophy: Secondary | ICD-10-CM | POA: Diagnosis not present

## 2023-05-20 DIAGNOSIS — Z992 Dependence on renal dialysis: Secondary | ICD-10-CM | POA: Diagnosis not present

## 2023-05-21 DIAGNOSIS — R5381 Other malaise: Secondary | ICD-10-CM | POA: Diagnosis not present

## 2023-05-23 DIAGNOSIS — H3320 Serous retinal detachment, unspecified eye: Secondary | ICD-10-CM | POA: Diagnosis not present

## 2023-05-23 DIAGNOSIS — Z794 Long term (current) use of insulin: Secondary | ICD-10-CM | POA: Diagnosis not present

## 2023-05-23 DIAGNOSIS — H3589 Other specified retinal disorders: Secondary | ICD-10-CM | POA: Diagnosis not present

## 2023-05-23 DIAGNOSIS — E113591 Type 2 diabetes mellitus with proliferative diabetic retinopathy without macular edema, right eye: Secondary | ICD-10-CM | POA: Diagnosis not present

## 2023-05-24 DIAGNOSIS — Z992 Dependence on renal dialysis: Secondary | ICD-10-CM | POA: Diagnosis not present

## 2023-05-24 DIAGNOSIS — N186 End stage renal disease: Secondary | ICD-10-CM | POA: Diagnosis not present

## 2023-05-26 DIAGNOSIS — I517 Cardiomegaly: Secondary | ICD-10-CM | POA: Diagnosis not present

## 2023-05-26 DIAGNOSIS — J811 Chronic pulmonary edema: Secondary | ICD-10-CM | POA: Diagnosis not present

## 2023-05-27 DIAGNOSIS — N186 End stage renal disease: Secondary | ICD-10-CM | POA: Diagnosis not present

## 2023-05-27 DIAGNOSIS — Z992 Dependence on renal dialysis: Secondary | ICD-10-CM | POA: Diagnosis not present

## 2023-05-29 DIAGNOSIS — N186 End stage renal disease: Secondary | ICD-10-CM | POA: Diagnosis not present

## 2023-05-29 DIAGNOSIS — Z992 Dependence on renal dialysis: Secondary | ICD-10-CM | POA: Diagnosis not present

## 2023-05-31 DIAGNOSIS — Z992 Dependence on renal dialysis: Secondary | ICD-10-CM | POA: Diagnosis not present

## 2023-05-31 DIAGNOSIS — N186 End stage renal disease: Secondary | ICD-10-CM | POA: Diagnosis not present

## 2023-06-02 DIAGNOSIS — R5381 Other malaise: Secondary | ICD-10-CM | POA: Diagnosis not present

## 2023-06-03 DIAGNOSIS — D509 Iron deficiency anemia, unspecified: Secondary | ICD-10-CM | POA: Diagnosis not present

## 2023-06-03 DIAGNOSIS — Z992 Dependence on renal dialysis: Secondary | ICD-10-CM | POA: Diagnosis not present

## 2023-06-03 DIAGNOSIS — N186 End stage renal disease: Secondary | ICD-10-CM | POA: Diagnosis not present

## 2023-06-05 DIAGNOSIS — N186 End stage renal disease: Secondary | ICD-10-CM | POA: Diagnosis not present

## 2023-06-05 DIAGNOSIS — Z992 Dependence on renal dialysis: Secondary | ICD-10-CM | POA: Diagnosis not present

## 2023-06-06 DIAGNOSIS — R5381 Other malaise: Secondary | ICD-10-CM | POA: Diagnosis not present

## 2023-06-06 DIAGNOSIS — R2689 Other abnormalities of gait and mobility: Secondary | ICD-10-CM | POA: Diagnosis not present

## 2023-06-06 DIAGNOSIS — S86811D Strain of other muscle(s) and tendon(s) at lower leg level, right leg, subsequent encounter: Secondary | ICD-10-CM | POA: Diagnosis not present

## 2023-06-06 DIAGNOSIS — M6281 Muscle weakness (generalized): Secondary | ICD-10-CM | POA: Diagnosis not present

## 2023-06-07 DIAGNOSIS — M6281 Muscle weakness (generalized): Secondary | ICD-10-CM | POA: Diagnosis not present

## 2023-06-07 DIAGNOSIS — N186 End stage renal disease: Secondary | ICD-10-CM | POA: Diagnosis not present

## 2023-06-07 DIAGNOSIS — Z992 Dependence on renal dialysis: Secondary | ICD-10-CM | POA: Diagnosis not present

## 2023-06-07 DIAGNOSIS — S86811D Strain of other muscle(s) and tendon(s) at lower leg level, right leg, subsequent encounter: Secondary | ICD-10-CM | POA: Diagnosis not present

## 2023-06-07 DIAGNOSIS — R2689 Other abnormalities of gait and mobility: Secondary | ICD-10-CM | POA: Diagnosis not present

## 2023-06-08 DIAGNOSIS — M6281 Muscle weakness (generalized): Secondary | ICD-10-CM | POA: Diagnosis not present

## 2023-06-08 DIAGNOSIS — R2689 Other abnormalities of gait and mobility: Secondary | ICD-10-CM | POA: Diagnosis not present

## 2023-06-08 DIAGNOSIS — S86811D Strain of other muscle(s) and tendon(s) at lower leg level, right leg, subsequent encounter: Secondary | ICD-10-CM | POA: Diagnosis not present

## 2023-06-09 DIAGNOSIS — R2689 Other abnormalities of gait and mobility: Secondary | ICD-10-CM | POA: Diagnosis not present

## 2023-06-09 DIAGNOSIS — H4053X0 Glaucoma secondary to other eye disorders, bilateral, stage unspecified: Secondary | ICD-10-CM | POA: Diagnosis not present

## 2023-06-09 DIAGNOSIS — H4389 Other disorders of vitreous body: Secondary | ICD-10-CM | POA: Diagnosis not present

## 2023-06-09 DIAGNOSIS — M6281 Muscle weakness (generalized): Secondary | ICD-10-CM | POA: Diagnosis not present

## 2023-06-09 DIAGNOSIS — S86811D Strain of other muscle(s) and tendon(s) at lower leg level, right leg, subsequent encounter: Secondary | ICD-10-CM | POA: Diagnosis not present

## 2023-06-09 DIAGNOSIS — H179 Unspecified corneal scar and opacity: Secondary | ICD-10-CM | POA: Diagnosis not present

## 2023-06-10 DIAGNOSIS — M6281 Muscle weakness (generalized): Secondary | ICD-10-CM | POA: Diagnosis not present

## 2023-06-10 DIAGNOSIS — R2689 Other abnormalities of gait and mobility: Secondary | ICD-10-CM | POA: Diagnosis not present

## 2023-06-10 DIAGNOSIS — Z992 Dependence on renal dialysis: Secondary | ICD-10-CM | POA: Diagnosis not present

## 2023-06-10 DIAGNOSIS — N186 End stage renal disease: Secondary | ICD-10-CM | POA: Diagnosis not present

## 2023-06-10 DIAGNOSIS — S86811D Strain of other muscle(s) and tendon(s) at lower leg level, right leg, subsequent encounter: Secondary | ICD-10-CM | POA: Diagnosis not present

## 2023-06-11 DIAGNOSIS — R5381 Other malaise: Secondary | ICD-10-CM | POA: Diagnosis not present

## 2023-06-12 DIAGNOSIS — N186 End stage renal disease: Secondary | ICD-10-CM | POA: Diagnosis not present

## 2023-06-12 DIAGNOSIS — S86811D Strain of other muscle(s) and tendon(s) at lower leg level, right leg, subsequent encounter: Secondary | ICD-10-CM | POA: Diagnosis not present

## 2023-06-12 DIAGNOSIS — M6281 Muscle weakness (generalized): Secondary | ICD-10-CM | POA: Diagnosis not present

## 2023-06-12 DIAGNOSIS — R2689 Other abnormalities of gait and mobility: Secondary | ICD-10-CM | POA: Diagnosis not present

## 2023-06-12 DIAGNOSIS — Z992 Dependence on renal dialysis: Secondary | ICD-10-CM | POA: Diagnosis not present

## 2023-06-13 DIAGNOSIS — R2689 Other abnormalities of gait and mobility: Secondary | ICD-10-CM | POA: Diagnosis not present

## 2023-06-13 DIAGNOSIS — M6281 Muscle weakness (generalized): Secondary | ICD-10-CM | POA: Diagnosis not present

## 2023-06-13 DIAGNOSIS — N186 End stage renal disease: Secondary | ICD-10-CM | POA: Diagnosis not present

## 2023-06-13 DIAGNOSIS — E1122 Type 2 diabetes mellitus with diabetic chronic kidney disease: Secondary | ICD-10-CM | POA: Diagnosis not present

## 2023-06-13 DIAGNOSIS — S86811D Strain of other muscle(s) and tendon(s) at lower leg level, right leg, subsequent encounter: Secondary | ICD-10-CM | POA: Diagnosis not present

## 2023-06-13 DIAGNOSIS — Z6841 Body Mass Index (BMI) 40.0 and over, adult: Secondary | ICD-10-CM | POA: Diagnosis not present

## 2023-06-13 DIAGNOSIS — L98491 Non-pressure chronic ulcer of skin of other sites limited to breakdown of skin: Secondary | ICD-10-CM | POA: Diagnosis not present

## 2023-06-14 DIAGNOSIS — Z992 Dependence on renal dialysis: Secondary | ICD-10-CM | POA: Diagnosis not present

## 2023-06-14 DIAGNOSIS — N186 End stage renal disease: Secondary | ICD-10-CM | POA: Diagnosis not present

## 2023-06-17 DIAGNOSIS — Z992 Dependence on renal dialysis: Secondary | ICD-10-CM | POA: Diagnosis not present

## 2023-06-17 DIAGNOSIS — N250 Renal osteodystrophy: Secondary | ICD-10-CM | POA: Diagnosis not present

## 2023-06-17 DIAGNOSIS — M6281 Muscle weakness (generalized): Secondary | ICD-10-CM | POA: Diagnosis not present

## 2023-06-17 DIAGNOSIS — N186 End stage renal disease: Secondary | ICD-10-CM | POA: Diagnosis not present

## 2023-06-17 DIAGNOSIS — S86811D Strain of other muscle(s) and tendon(s) at lower leg level, right leg, subsequent encounter: Secondary | ICD-10-CM | POA: Diagnosis not present

## 2023-06-17 DIAGNOSIS — R2689 Other abnormalities of gait and mobility: Secondary | ICD-10-CM | POA: Diagnosis not present

## 2023-06-19 DIAGNOSIS — Z992 Dependence on renal dialysis: Secondary | ICD-10-CM | POA: Diagnosis not present

## 2023-06-19 DIAGNOSIS — N186 End stage renal disease: Secondary | ICD-10-CM | POA: Diagnosis not present

## 2023-06-20 DIAGNOSIS — S21012A Laceration without foreign body of left breast, initial encounter: Secondary | ICD-10-CM | POA: Diagnosis not present

## 2023-06-20 DIAGNOSIS — R5381 Other malaise: Secondary | ICD-10-CM | POA: Diagnosis not present

## 2023-06-20 DIAGNOSIS — L02214 Cutaneous abscess of groin: Secondary | ICD-10-CM | POA: Diagnosis not present

## 2023-06-20 DIAGNOSIS — Z6841 Body Mass Index (BMI) 40.0 and over, adult: Secondary | ICD-10-CM | POA: Diagnosis not present

## 2023-06-20 DIAGNOSIS — N186 End stage renal disease: Secondary | ICD-10-CM | POA: Diagnosis not present

## 2023-06-20 DIAGNOSIS — E1122 Type 2 diabetes mellitus with diabetic chronic kidney disease: Secondary | ICD-10-CM | POA: Diagnosis not present

## 2023-06-21 DIAGNOSIS — Z992 Dependence on renal dialysis: Secondary | ICD-10-CM | POA: Diagnosis not present

## 2023-06-21 DIAGNOSIS — N186 End stage renal disease: Secondary | ICD-10-CM | POA: Diagnosis not present

## 2023-06-23 DIAGNOSIS — G4733 Obstructive sleep apnea (adult) (pediatric): Secondary | ICD-10-CM | POA: Diagnosis not present

## 2023-06-23 DIAGNOSIS — I10 Essential (primary) hypertension: Secondary | ICD-10-CM | POA: Diagnosis not present

## 2023-06-24 DIAGNOSIS — Z992 Dependence on renal dialysis: Secondary | ICD-10-CM | POA: Diagnosis not present

## 2023-06-24 DIAGNOSIS — N186 End stage renal disease: Secondary | ICD-10-CM | POA: Diagnosis not present

## 2023-06-25 DIAGNOSIS — G4733 Obstructive sleep apnea (adult) (pediatric): Secondary | ICD-10-CM | POA: Diagnosis not present

## 2023-06-25 DIAGNOSIS — I10 Essential (primary) hypertension: Secondary | ICD-10-CM | POA: Diagnosis not present

## 2023-06-26 DIAGNOSIS — Z992 Dependence on renal dialysis: Secondary | ICD-10-CM | POA: Diagnosis not present

## 2023-06-26 DIAGNOSIS — N186 End stage renal disease: Secondary | ICD-10-CM | POA: Diagnosis not present

## 2023-06-27 DIAGNOSIS — N186 End stage renal disease: Secondary | ICD-10-CM | POA: Diagnosis not present

## 2023-06-27 DIAGNOSIS — L02214 Cutaneous abscess of groin: Secondary | ICD-10-CM | POA: Diagnosis not present

## 2023-06-27 DIAGNOSIS — Z6841 Body Mass Index (BMI) 40.0 and over, adult: Secondary | ICD-10-CM | POA: Diagnosis not present

## 2023-06-28 DIAGNOSIS — Z992 Dependence on renal dialysis: Secondary | ICD-10-CM | POA: Diagnosis not present

## 2023-06-28 DIAGNOSIS — N186 End stage renal disease: Secondary | ICD-10-CM | POA: Diagnosis not present

## 2023-06-29 DIAGNOSIS — G4733 Obstructive sleep apnea (adult) (pediatric): Secondary | ICD-10-CM | POA: Diagnosis not present

## 2023-06-29 DIAGNOSIS — I10 Essential (primary) hypertension: Secondary | ICD-10-CM | POA: Diagnosis not present

## 2023-07-01 DIAGNOSIS — D509 Iron deficiency anemia, unspecified: Secondary | ICD-10-CM | POA: Diagnosis not present

## 2023-07-01 DIAGNOSIS — Z992 Dependence on renal dialysis: Secondary | ICD-10-CM | POA: Diagnosis not present

## 2023-07-01 DIAGNOSIS — N186 End stage renal disease: Secondary | ICD-10-CM | POA: Diagnosis not present

## 2023-07-03 DIAGNOSIS — Z992 Dependence on renal dialysis: Secondary | ICD-10-CM | POA: Diagnosis not present

## 2023-07-03 DIAGNOSIS — N186 End stage renal disease: Secondary | ICD-10-CM | POA: Diagnosis not present

## 2023-07-04 DIAGNOSIS — R2689 Other abnormalities of gait and mobility: Secondary | ICD-10-CM | POA: Diagnosis not present

## 2023-07-04 DIAGNOSIS — L02214 Cutaneous abscess of groin: Secondary | ICD-10-CM | POA: Diagnosis not present

## 2023-07-04 DIAGNOSIS — M6281 Muscle weakness (generalized): Secondary | ICD-10-CM | POA: Diagnosis not present

## 2023-07-04 DIAGNOSIS — N186 End stage renal disease: Secondary | ICD-10-CM | POA: Diagnosis not present

## 2023-07-04 DIAGNOSIS — S86811D Strain of other muscle(s) and tendon(s) at lower leg level, right leg, subsequent encounter: Secondary | ICD-10-CM | POA: Diagnosis not present

## 2023-07-04 DIAGNOSIS — Z6841 Body Mass Index (BMI) 40.0 and over, adult: Secondary | ICD-10-CM | POA: Diagnosis not present

## 2023-07-05 DIAGNOSIS — Z992 Dependence on renal dialysis: Secondary | ICD-10-CM | POA: Diagnosis not present

## 2023-07-05 DIAGNOSIS — R2689 Other abnormalities of gait and mobility: Secondary | ICD-10-CM | POA: Diagnosis not present

## 2023-07-05 DIAGNOSIS — N186 End stage renal disease: Secondary | ICD-10-CM | POA: Diagnosis not present

## 2023-07-05 DIAGNOSIS — S86811D Strain of other muscle(s) and tendon(s) at lower leg level, right leg, subsequent encounter: Secondary | ICD-10-CM | POA: Diagnosis not present

## 2023-07-05 DIAGNOSIS — M6281 Muscle weakness (generalized): Secondary | ICD-10-CM | POA: Diagnosis not present

## 2023-07-08 DIAGNOSIS — Z992 Dependence on renal dialysis: Secondary | ICD-10-CM | POA: Diagnosis not present

## 2023-07-08 DIAGNOSIS — R2689 Other abnormalities of gait and mobility: Secondary | ICD-10-CM | POA: Diagnosis not present

## 2023-07-08 DIAGNOSIS — N186 End stage renal disease: Secondary | ICD-10-CM | POA: Diagnosis not present

## 2023-07-08 DIAGNOSIS — S86811D Strain of other muscle(s) and tendon(s) at lower leg level, right leg, subsequent encounter: Secondary | ICD-10-CM | POA: Diagnosis not present

## 2023-07-08 DIAGNOSIS — M6281 Muscle weakness (generalized): Secondary | ICD-10-CM | POA: Diagnosis not present

## 2023-07-08 DIAGNOSIS — Z23 Encounter for immunization: Secondary | ICD-10-CM | POA: Diagnosis not present

## 2023-07-09 DIAGNOSIS — S86811D Strain of other muscle(s) and tendon(s) at lower leg level, right leg, subsequent encounter: Secondary | ICD-10-CM | POA: Diagnosis not present

## 2023-07-09 DIAGNOSIS — R2689 Other abnormalities of gait and mobility: Secondary | ICD-10-CM | POA: Diagnosis not present

## 2023-07-09 DIAGNOSIS — M6281 Muscle weakness (generalized): Secondary | ICD-10-CM | POA: Diagnosis not present

## 2023-07-09 DIAGNOSIS — N186 End stage renal disease: Secondary | ICD-10-CM | POA: Diagnosis not present

## 2023-07-10 DIAGNOSIS — S86811D Strain of other muscle(s) and tendon(s) at lower leg level, right leg, subsequent encounter: Secondary | ICD-10-CM | POA: Diagnosis not present

## 2023-07-10 DIAGNOSIS — Z992 Dependence on renal dialysis: Secondary | ICD-10-CM | POA: Diagnosis not present

## 2023-07-10 DIAGNOSIS — M6281 Muscle weakness (generalized): Secondary | ICD-10-CM | POA: Diagnosis not present

## 2023-07-10 DIAGNOSIS — N186 End stage renal disease: Secondary | ICD-10-CM | POA: Diagnosis not present

## 2023-07-10 DIAGNOSIS — R2689 Other abnormalities of gait and mobility: Secondary | ICD-10-CM | POA: Diagnosis not present

## 2023-07-11 DIAGNOSIS — S86811D Strain of other muscle(s) and tendon(s) at lower leg level, right leg, subsequent encounter: Secondary | ICD-10-CM | POA: Diagnosis not present

## 2023-07-11 DIAGNOSIS — M6281 Muscle weakness (generalized): Secondary | ICD-10-CM | POA: Diagnosis not present

## 2023-07-11 DIAGNOSIS — Z6841 Body Mass Index (BMI) 40.0 and over, adult: Secondary | ICD-10-CM | POA: Diagnosis not present

## 2023-07-11 DIAGNOSIS — R2689 Other abnormalities of gait and mobility: Secondary | ICD-10-CM | POA: Diagnosis not present

## 2023-07-11 DIAGNOSIS — N186 End stage renal disease: Secondary | ICD-10-CM | POA: Diagnosis not present

## 2023-07-11 DIAGNOSIS — L02214 Cutaneous abscess of groin: Secondary | ICD-10-CM | POA: Diagnosis not present

## 2023-07-12 DIAGNOSIS — M6281 Muscle weakness (generalized): Secondary | ICD-10-CM | POA: Diagnosis not present

## 2023-07-12 DIAGNOSIS — Z992 Dependence on renal dialysis: Secondary | ICD-10-CM | POA: Diagnosis not present

## 2023-07-12 DIAGNOSIS — S86811D Strain of other muscle(s) and tendon(s) at lower leg level, right leg, subsequent encounter: Secondary | ICD-10-CM | POA: Diagnosis not present

## 2023-07-12 DIAGNOSIS — N186 End stage renal disease: Secondary | ICD-10-CM | POA: Diagnosis not present

## 2023-07-12 DIAGNOSIS — R2689 Other abnormalities of gait and mobility: Secondary | ICD-10-CM | POA: Diagnosis not present

## 2023-07-14 DIAGNOSIS — N186 End stage renal disease: Secondary | ICD-10-CM | POA: Diagnosis not present

## 2023-07-15 DIAGNOSIS — N186 End stage renal disease: Secondary | ICD-10-CM | POA: Diagnosis not present

## 2023-07-15 DIAGNOSIS — Z992 Dependence on renal dialysis: Secondary | ICD-10-CM | POA: Diagnosis not present

## 2023-07-16 DIAGNOSIS — S86811D Strain of other muscle(s) and tendon(s) at lower leg level, right leg, subsequent encounter: Secondary | ICD-10-CM | POA: Diagnosis not present

## 2023-07-16 DIAGNOSIS — M6281 Muscle weakness (generalized): Secondary | ICD-10-CM | POA: Diagnosis not present

## 2023-07-16 DIAGNOSIS — R2689 Other abnormalities of gait and mobility: Secondary | ICD-10-CM | POA: Diagnosis not present

## 2023-07-16 DIAGNOSIS — N186 End stage renal disease: Secondary | ICD-10-CM | POA: Diagnosis not present

## 2023-07-17 DIAGNOSIS — Z992 Dependence on renal dialysis: Secondary | ICD-10-CM | POA: Diagnosis not present

## 2023-07-17 DIAGNOSIS — N186 End stage renal disease: Secondary | ICD-10-CM | POA: Diagnosis not present

## 2023-07-17 DIAGNOSIS — R2689 Other abnormalities of gait and mobility: Secondary | ICD-10-CM | POA: Diagnosis not present

## 2023-07-17 DIAGNOSIS — S86811D Strain of other muscle(s) and tendon(s) at lower leg level, right leg, subsequent encounter: Secondary | ICD-10-CM | POA: Diagnosis not present

## 2023-07-17 DIAGNOSIS — M6281 Muscle weakness (generalized): Secondary | ICD-10-CM | POA: Diagnosis not present

## 2023-07-18 DIAGNOSIS — N186 End stage renal disease: Secondary | ICD-10-CM | POA: Diagnosis not present

## 2023-07-18 DIAGNOSIS — L02214 Cutaneous abscess of groin: Secondary | ICD-10-CM | POA: Diagnosis not present

## 2023-07-18 DIAGNOSIS — Z6841 Body Mass Index (BMI) 40.0 and over, adult: Secondary | ICD-10-CM | POA: Diagnosis not present

## 2023-07-18 DIAGNOSIS — M6281 Muscle weakness (generalized): Secondary | ICD-10-CM | POA: Diagnosis not present

## 2023-07-18 DIAGNOSIS — R2689 Other abnormalities of gait and mobility: Secondary | ICD-10-CM | POA: Diagnosis not present

## 2023-07-18 DIAGNOSIS — S86811D Strain of other muscle(s) and tendon(s) at lower leg level, right leg, subsequent encounter: Secondary | ICD-10-CM | POA: Diagnosis not present

## 2023-07-19 DIAGNOSIS — S86811D Strain of other muscle(s) and tendon(s) at lower leg level, right leg, subsequent encounter: Secondary | ICD-10-CM | POA: Diagnosis not present

## 2023-07-19 DIAGNOSIS — M6281 Muscle weakness (generalized): Secondary | ICD-10-CM | POA: Diagnosis not present

## 2023-07-19 DIAGNOSIS — Z992 Dependence on renal dialysis: Secondary | ICD-10-CM | POA: Diagnosis not present

## 2023-07-19 DIAGNOSIS — R2689 Other abnormalities of gait and mobility: Secondary | ICD-10-CM | POA: Diagnosis not present

## 2023-07-19 DIAGNOSIS — N186 End stage renal disease: Secondary | ICD-10-CM | POA: Diagnosis not present

## 2023-07-22 DIAGNOSIS — R2689 Other abnormalities of gait and mobility: Secondary | ICD-10-CM | POA: Diagnosis not present

## 2023-07-22 DIAGNOSIS — Z992 Dependence on renal dialysis: Secondary | ICD-10-CM | POA: Diagnosis not present

## 2023-07-22 DIAGNOSIS — S86811D Strain of other muscle(s) and tendon(s) at lower leg level, right leg, subsequent encounter: Secondary | ICD-10-CM | POA: Diagnosis not present

## 2023-07-22 DIAGNOSIS — M6281 Muscle weakness (generalized): Secondary | ICD-10-CM | POA: Diagnosis not present

## 2023-07-22 DIAGNOSIS — N186 End stage renal disease: Secondary | ICD-10-CM | POA: Diagnosis not present

## 2023-07-23 DIAGNOSIS — N186 End stage renal disease: Secondary | ICD-10-CM | POA: Diagnosis not present

## 2023-07-23 DIAGNOSIS — I10 Essential (primary) hypertension: Secondary | ICD-10-CM | POA: Diagnosis not present

## 2023-07-23 DIAGNOSIS — G4733 Obstructive sleep apnea (adult) (pediatric): Secondary | ICD-10-CM | POA: Diagnosis not present

## 2023-07-23 DIAGNOSIS — M6281 Muscle weakness (generalized): Secondary | ICD-10-CM | POA: Diagnosis not present

## 2023-07-23 DIAGNOSIS — S86811D Strain of other muscle(s) and tendon(s) at lower leg level, right leg, subsequent encounter: Secondary | ICD-10-CM | POA: Diagnosis not present

## 2023-07-23 DIAGNOSIS — R2689 Other abnormalities of gait and mobility: Secondary | ICD-10-CM | POA: Diagnosis not present

## 2023-07-24 DIAGNOSIS — M6281 Muscle weakness (generalized): Secondary | ICD-10-CM | POA: Diagnosis not present

## 2023-07-24 DIAGNOSIS — Z992 Dependence on renal dialysis: Secondary | ICD-10-CM | POA: Diagnosis not present

## 2023-07-24 DIAGNOSIS — N390 Urinary tract infection, site not specified: Secondary | ICD-10-CM | POA: Diagnosis not present

## 2023-07-24 DIAGNOSIS — R2689 Other abnormalities of gait and mobility: Secondary | ICD-10-CM | POA: Diagnosis not present

## 2023-07-24 DIAGNOSIS — N186 End stage renal disease: Secondary | ICD-10-CM | POA: Diagnosis not present

## 2023-07-24 DIAGNOSIS — S86811D Strain of other muscle(s) and tendon(s) at lower leg level, right leg, subsequent encounter: Secondary | ICD-10-CM | POA: Diagnosis not present

## 2023-07-25 DIAGNOSIS — S86811D Strain of other muscle(s) and tendon(s) at lower leg level, right leg, subsequent encounter: Secondary | ICD-10-CM | POA: Diagnosis not present

## 2023-07-25 DIAGNOSIS — Z6841 Body Mass Index (BMI) 40.0 and over, adult: Secondary | ICD-10-CM | POA: Diagnosis not present

## 2023-07-25 DIAGNOSIS — R2689 Other abnormalities of gait and mobility: Secondary | ICD-10-CM | POA: Diagnosis not present

## 2023-07-25 DIAGNOSIS — N186 End stage renal disease: Secondary | ICD-10-CM | POA: Diagnosis not present

## 2023-07-25 DIAGNOSIS — L02214 Cutaneous abscess of groin: Secondary | ICD-10-CM | POA: Diagnosis not present

## 2023-07-25 DIAGNOSIS — M6281 Muscle weakness (generalized): Secondary | ICD-10-CM | POA: Diagnosis not present

## 2023-07-26 DIAGNOSIS — M6281 Muscle weakness (generalized): Secondary | ICD-10-CM | POA: Diagnosis not present

## 2023-07-26 DIAGNOSIS — S86811D Strain of other muscle(s) and tendon(s) at lower leg level, right leg, subsequent encounter: Secondary | ICD-10-CM | POA: Diagnosis not present

## 2023-07-26 DIAGNOSIS — Z992 Dependence on renal dialysis: Secondary | ICD-10-CM | POA: Diagnosis not present

## 2023-07-26 DIAGNOSIS — R2689 Other abnormalities of gait and mobility: Secondary | ICD-10-CM | POA: Diagnosis not present

## 2023-07-26 DIAGNOSIS — N186 End stage renal disease: Secondary | ICD-10-CM | POA: Diagnosis not present

## 2023-07-28 DIAGNOSIS — R5381 Other malaise: Secondary | ICD-10-CM | POA: Diagnosis not present

## 2023-07-29 DIAGNOSIS — N250 Renal osteodystrophy: Secondary | ICD-10-CM | POA: Diagnosis not present

## 2023-07-29 DIAGNOSIS — S86811D Strain of other muscle(s) and tendon(s) at lower leg level, right leg, subsequent encounter: Secondary | ICD-10-CM | POA: Diagnosis not present

## 2023-07-29 DIAGNOSIS — R2689 Other abnormalities of gait and mobility: Secondary | ICD-10-CM | POA: Diagnosis not present

## 2023-07-29 DIAGNOSIS — Z992 Dependence on renal dialysis: Secondary | ICD-10-CM | POA: Diagnosis not present

## 2023-07-29 DIAGNOSIS — M6281 Muscle weakness (generalized): Secondary | ICD-10-CM | POA: Diagnosis not present

## 2023-07-29 DIAGNOSIS — N186 End stage renal disease: Secondary | ICD-10-CM | POA: Diagnosis not present

## 2023-07-30 DIAGNOSIS — R2689 Other abnormalities of gait and mobility: Secondary | ICD-10-CM | POA: Diagnosis not present

## 2023-07-30 DIAGNOSIS — R5381 Other malaise: Secondary | ICD-10-CM | POA: Diagnosis not present

## 2023-07-30 DIAGNOSIS — N186 End stage renal disease: Secondary | ICD-10-CM | POA: Diagnosis not present

## 2023-07-30 DIAGNOSIS — M6281 Muscle weakness (generalized): Secondary | ICD-10-CM | POA: Diagnosis not present

## 2023-07-30 DIAGNOSIS — S86811D Strain of other muscle(s) and tendon(s) at lower leg level, right leg, subsequent encounter: Secondary | ICD-10-CM | POA: Diagnosis not present

## 2023-07-31 DIAGNOSIS — M6281 Muscle weakness (generalized): Secondary | ICD-10-CM | POA: Diagnosis not present

## 2023-07-31 DIAGNOSIS — N186 End stage renal disease: Secondary | ICD-10-CM | POA: Diagnosis not present

## 2023-07-31 DIAGNOSIS — S86811D Strain of other muscle(s) and tendon(s) at lower leg level, right leg, subsequent encounter: Secondary | ICD-10-CM | POA: Diagnosis not present

## 2023-07-31 DIAGNOSIS — R2689 Other abnormalities of gait and mobility: Secondary | ICD-10-CM | POA: Diagnosis not present

## 2023-07-31 DIAGNOSIS — Z992 Dependence on renal dialysis: Secondary | ICD-10-CM | POA: Diagnosis not present

## 2023-07-31 DIAGNOSIS — Z1159 Encounter for screening for other viral diseases: Secondary | ICD-10-CM | POA: Diagnosis not present

## 2023-08-01 DIAGNOSIS — S86811D Strain of other muscle(s) and tendon(s) at lower leg level, right leg, subsequent encounter: Secondary | ICD-10-CM | POA: Diagnosis not present

## 2023-08-01 DIAGNOSIS — M6281 Muscle weakness (generalized): Secondary | ICD-10-CM | POA: Diagnosis not present

## 2023-08-01 DIAGNOSIS — R2689 Other abnormalities of gait and mobility: Secondary | ICD-10-CM | POA: Diagnosis not present

## 2023-08-01 DIAGNOSIS — N186 End stage renal disease: Secondary | ICD-10-CM | POA: Diagnosis not present

## 2023-08-02 DIAGNOSIS — S86811D Strain of other muscle(s) and tendon(s) at lower leg level, right leg, subsequent encounter: Secondary | ICD-10-CM | POA: Diagnosis not present

## 2023-08-02 DIAGNOSIS — M6281 Muscle weakness (generalized): Secondary | ICD-10-CM | POA: Diagnosis not present

## 2023-08-02 DIAGNOSIS — Z992 Dependence on renal dialysis: Secondary | ICD-10-CM | POA: Diagnosis not present

## 2023-08-02 DIAGNOSIS — R2689 Other abnormalities of gait and mobility: Secondary | ICD-10-CM | POA: Diagnosis not present

## 2023-08-02 DIAGNOSIS — N186 End stage renal disease: Secondary | ICD-10-CM | POA: Diagnosis not present

## 2023-08-05 DIAGNOSIS — S86811D Strain of other muscle(s) and tendon(s) at lower leg level, right leg, subsequent encounter: Secondary | ICD-10-CM | POA: Diagnosis not present

## 2023-08-05 DIAGNOSIS — R2689 Other abnormalities of gait and mobility: Secondary | ICD-10-CM | POA: Diagnosis not present

## 2023-08-05 DIAGNOSIS — Z992 Dependence on renal dialysis: Secondary | ICD-10-CM | POA: Diagnosis not present

## 2023-08-05 DIAGNOSIS — N186 End stage renal disease: Secondary | ICD-10-CM | POA: Diagnosis not present

## 2023-08-05 DIAGNOSIS — M6281 Muscle weakness (generalized): Secondary | ICD-10-CM | POA: Diagnosis not present

## 2023-08-05 DIAGNOSIS — D509 Iron deficiency anemia, unspecified: Secondary | ICD-10-CM | POA: Diagnosis not present

## 2023-08-06 DIAGNOSIS — M6281 Muscle weakness (generalized): Secondary | ICD-10-CM | POA: Diagnosis not present

## 2023-08-06 DIAGNOSIS — S86811D Strain of other muscle(s) and tendon(s) at lower leg level, right leg, subsequent encounter: Secondary | ICD-10-CM | POA: Diagnosis not present

## 2023-08-06 DIAGNOSIS — N186 End stage renal disease: Secondary | ICD-10-CM | POA: Diagnosis not present

## 2023-08-06 DIAGNOSIS — R2689 Other abnormalities of gait and mobility: Secondary | ICD-10-CM | POA: Diagnosis not present

## 2023-08-07 DIAGNOSIS — R2689 Other abnormalities of gait and mobility: Secondary | ICD-10-CM | POA: Diagnosis not present

## 2023-08-07 DIAGNOSIS — N186 End stage renal disease: Secondary | ICD-10-CM | POA: Diagnosis not present

## 2023-08-07 DIAGNOSIS — M6281 Muscle weakness (generalized): Secondary | ICD-10-CM | POA: Diagnosis not present

## 2023-08-07 DIAGNOSIS — S86811D Strain of other muscle(s) and tendon(s) at lower leg level, right leg, subsequent encounter: Secondary | ICD-10-CM | POA: Diagnosis not present

## 2023-08-07 DIAGNOSIS — Z992 Dependence on renal dialysis: Secondary | ICD-10-CM | POA: Diagnosis not present

## 2023-08-08 DIAGNOSIS — R5381 Other malaise: Secondary | ICD-10-CM | POA: Diagnosis not present

## 2023-08-09 DIAGNOSIS — R2689 Other abnormalities of gait and mobility: Secondary | ICD-10-CM | POA: Diagnosis not present

## 2023-08-09 DIAGNOSIS — N186 End stage renal disease: Secondary | ICD-10-CM | POA: Diagnosis not present

## 2023-08-09 DIAGNOSIS — M6281 Muscle weakness (generalized): Secondary | ICD-10-CM | POA: Diagnosis not present

## 2023-08-09 DIAGNOSIS — S86811D Strain of other muscle(s) and tendon(s) at lower leg level, right leg, subsequent encounter: Secondary | ICD-10-CM | POA: Diagnosis not present

## 2023-08-09 DIAGNOSIS — Z992 Dependence on renal dialysis: Secondary | ICD-10-CM | POA: Diagnosis not present

## 2023-08-11 DIAGNOSIS — R2689 Other abnormalities of gait and mobility: Secondary | ICD-10-CM | POA: Diagnosis not present

## 2023-08-11 DIAGNOSIS — S86811D Strain of other muscle(s) and tendon(s) at lower leg level, right leg, subsequent encounter: Secondary | ICD-10-CM | POA: Diagnosis not present

## 2023-08-11 DIAGNOSIS — N186 End stage renal disease: Secondary | ICD-10-CM | POA: Diagnosis not present

## 2023-08-11 DIAGNOSIS — M6281 Muscle weakness (generalized): Secondary | ICD-10-CM | POA: Diagnosis not present

## 2023-08-12 DIAGNOSIS — Z992 Dependence on renal dialysis: Secondary | ICD-10-CM | POA: Diagnosis not present

## 2023-08-12 DIAGNOSIS — M6281 Muscle weakness (generalized): Secondary | ICD-10-CM | POA: Diagnosis not present

## 2023-08-12 DIAGNOSIS — N186 End stage renal disease: Secondary | ICD-10-CM | POA: Diagnosis not present

## 2023-08-12 DIAGNOSIS — R2689 Other abnormalities of gait and mobility: Secondary | ICD-10-CM | POA: Diagnosis not present

## 2023-08-12 DIAGNOSIS — S86811D Strain of other muscle(s) and tendon(s) at lower leg level, right leg, subsequent encounter: Secondary | ICD-10-CM | POA: Diagnosis not present

## 2023-08-13 DIAGNOSIS — N186 End stage renal disease: Secondary | ICD-10-CM | POA: Diagnosis not present

## 2023-08-13 DIAGNOSIS — S86811D Strain of other muscle(s) and tendon(s) at lower leg level, right leg, subsequent encounter: Secondary | ICD-10-CM | POA: Diagnosis not present

## 2023-08-13 DIAGNOSIS — M6281 Muscle weakness (generalized): Secondary | ICD-10-CM | POA: Diagnosis not present

## 2023-08-13 DIAGNOSIS — R2689 Other abnormalities of gait and mobility: Secondary | ICD-10-CM | POA: Diagnosis not present

## 2023-08-14 DIAGNOSIS — R2689 Other abnormalities of gait and mobility: Secondary | ICD-10-CM | POA: Diagnosis not present

## 2023-08-14 DIAGNOSIS — N186 End stage renal disease: Secondary | ICD-10-CM | POA: Diagnosis not present

## 2023-08-14 DIAGNOSIS — M6281 Muscle weakness (generalized): Secondary | ICD-10-CM | POA: Diagnosis not present

## 2023-08-14 DIAGNOSIS — Z992 Dependence on renal dialysis: Secondary | ICD-10-CM | POA: Diagnosis not present

## 2023-08-14 DIAGNOSIS — S86811D Strain of other muscle(s) and tendon(s) at lower leg level, right leg, subsequent encounter: Secondary | ICD-10-CM | POA: Diagnosis not present

## 2023-08-15 DIAGNOSIS — R2689 Other abnormalities of gait and mobility: Secondary | ICD-10-CM | POA: Diagnosis not present

## 2023-08-15 DIAGNOSIS — N186 End stage renal disease: Secondary | ICD-10-CM | POA: Diagnosis not present

## 2023-08-15 DIAGNOSIS — M6281 Muscle weakness (generalized): Secondary | ICD-10-CM | POA: Diagnosis not present

## 2023-08-15 DIAGNOSIS — S86811D Strain of other muscle(s) and tendon(s) at lower leg level, right leg, subsequent encounter: Secondary | ICD-10-CM | POA: Diagnosis not present

## 2023-08-16 DIAGNOSIS — Z992 Dependence on renal dialysis: Secondary | ICD-10-CM | POA: Diagnosis not present

## 2023-08-16 DIAGNOSIS — N186 End stage renal disease: Secondary | ICD-10-CM | POA: Diagnosis not present

## 2023-08-18 DIAGNOSIS — M6281 Muscle weakness (generalized): Secondary | ICD-10-CM | POA: Diagnosis not present

## 2023-08-18 DIAGNOSIS — R2689 Other abnormalities of gait and mobility: Secondary | ICD-10-CM | POA: Diagnosis not present

## 2023-08-18 DIAGNOSIS — S86811D Strain of other muscle(s) and tendon(s) at lower leg level, right leg, subsequent encounter: Secondary | ICD-10-CM | POA: Diagnosis not present

## 2023-08-18 DIAGNOSIS — N186 End stage renal disease: Secondary | ICD-10-CM | POA: Diagnosis not present

## 2023-08-19 DIAGNOSIS — N250 Renal osteodystrophy: Secondary | ICD-10-CM | POA: Diagnosis not present

## 2023-08-19 DIAGNOSIS — R2689 Other abnormalities of gait and mobility: Secondary | ICD-10-CM | POA: Diagnosis not present

## 2023-08-19 DIAGNOSIS — N186 End stage renal disease: Secondary | ICD-10-CM | POA: Diagnosis not present

## 2023-08-19 DIAGNOSIS — Z992 Dependence on renal dialysis: Secondary | ICD-10-CM | POA: Diagnosis not present

## 2023-08-19 DIAGNOSIS — M6281 Muscle weakness (generalized): Secondary | ICD-10-CM | POA: Diagnosis not present

## 2023-08-19 DIAGNOSIS — S86811D Strain of other muscle(s) and tendon(s) at lower leg level, right leg, subsequent encounter: Secondary | ICD-10-CM | POA: Diagnosis not present

## 2023-08-20 DIAGNOSIS — N186 End stage renal disease: Secondary | ICD-10-CM | POA: Diagnosis not present

## 2023-08-20 DIAGNOSIS — S86811D Strain of other muscle(s) and tendon(s) at lower leg level, right leg, subsequent encounter: Secondary | ICD-10-CM | POA: Diagnosis not present

## 2023-08-20 DIAGNOSIS — R2689 Other abnormalities of gait and mobility: Secondary | ICD-10-CM | POA: Diagnosis not present

## 2023-08-20 DIAGNOSIS — M6281 Muscle weakness (generalized): Secondary | ICD-10-CM | POA: Diagnosis not present

## 2023-08-21 DIAGNOSIS — N186 End stage renal disease: Secondary | ICD-10-CM | POA: Diagnosis not present

## 2023-08-21 DIAGNOSIS — Z992 Dependence on renal dialysis: Secondary | ICD-10-CM | POA: Diagnosis not present

## 2023-08-21 DIAGNOSIS — S86811D Strain of other muscle(s) and tendon(s) at lower leg level, right leg, subsequent encounter: Secondary | ICD-10-CM | POA: Diagnosis not present

## 2023-08-21 DIAGNOSIS — M6281 Muscle weakness (generalized): Secondary | ICD-10-CM | POA: Diagnosis not present

## 2023-08-21 DIAGNOSIS — R2689 Other abnormalities of gait and mobility: Secondary | ICD-10-CM | POA: Diagnosis not present

## 2023-08-22 DIAGNOSIS — S86811D Strain of other muscle(s) and tendon(s) at lower leg level, right leg, subsequent encounter: Secondary | ICD-10-CM | POA: Diagnosis not present

## 2023-08-22 DIAGNOSIS — R2689 Other abnormalities of gait and mobility: Secondary | ICD-10-CM | POA: Diagnosis not present

## 2023-08-22 DIAGNOSIS — M6281 Muscle weakness (generalized): Secondary | ICD-10-CM | POA: Diagnosis not present

## 2023-08-22 DIAGNOSIS — N186 End stage renal disease: Secondary | ICD-10-CM | POA: Diagnosis not present

## 2023-08-23 DIAGNOSIS — Z992 Dependence on renal dialysis: Secondary | ICD-10-CM | POA: Diagnosis not present

## 2023-08-23 DIAGNOSIS — N186 End stage renal disease: Secondary | ICD-10-CM | POA: Diagnosis not present

## 2023-08-23 DIAGNOSIS — G4733 Obstructive sleep apnea (adult) (pediatric): Secondary | ICD-10-CM | POA: Diagnosis not present

## 2023-08-23 DIAGNOSIS — I10 Essential (primary) hypertension: Secondary | ICD-10-CM | POA: Diagnosis not present

## 2023-08-25 DIAGNOSIS — S86811D Strain of other muscle(s) and tendon(s) at lower leg level, right leg, subsequent encounter: Secondary | ICD-10-CM | POA: Diagnosis not present

## 2023-08-25 DIAGNOSIS — E875 Hyperkalemia: Secondary | ICD-10-CM | POA: Diagnosis not present

## 2023-08-25 DIAGNOSIS — R52 Pain, unspecified: Secondary | ICD-10-CM | POA: Diagnosis not present

## 2023-08-25 DIAGNOSIS — D631 Anemia in chronic kidney disease: Secondary | ICD-10-CM | POA: Diagnosis not present

## 2023-08-25 DIAGNOSIS — Z992 Dependence on renal dialysis: Secondary | ICD-10-CM | POA: Diagnosis not present

## 2023-08-25 DIAGNOSIS — N2581 Secondary hyperparathyroidism of renal origin: Secondary | ICD-10-CM | POA: Diagnosis not present

## 2023-08-25 DIAGNOSIS — R2689 Other abnormalities of gait and mobility: Secondary | ICD-10-CM | POA: Diagnosis not present

## 2023-08-25 DIAGNOSIS — M6281 Muscle weakness (generalized): Secondary | ICD-10-CM | POA: Diagnosis not present

## 2023-08-25 DIAGNOSIS — E1122 Type 2 diabetes mellitus with diabetic chronic kidney disease: Secondary | ICD-10-CM | POA: Diagnosis not present

## 2023-08-25 DIAGNOSIS — N186 End stage renal disease: Secondary | ICD-10-CM | POA: Diagnosis not present

## 2023-08-25 DIAGNOSIS — D509 Iron deficiency anemia, unspecified: Secondary | ICD-10-CM | POA: Diagnosis not present

## 2023-08-26 DIAGNOSIS — M6281 Muscle weakness (generalized): Secondary | ICD-10-CM | POA: Diagnosis not present

## 2023-08-26 DIAGNOSIS — N186 End stage renal disease: Secondary | ICD-10-CM | POA: Diagnosis not present

## 2023-08-26 DIAGNOSIS — R5381 Other malaise: Secondary | ICD-10-CM | POA: Diagnosis not present

## 2023-08-26 DIAGNOSIS — S86811D Strain of other muscle(s) and tendon(s) at lower leg level, right leg, subsequent encounter: Secondary | ICD-10-CM | POA: Diagnosis not present

## 2023-08-26 DIAGNOSIS — R2689 Other abnormalities of gait and mobility: Secondary | ICD-10-CM | POA: Diagnosis not present

## 2023-08-26 DIAGNOSIS — N390 Urinary tract infection, site not specified: Secondary | ICD-10-CM | POA: Diagnosis not present

## 2023-08-27 DIAGNOSIS — Z992 Dependence on renal dialysis: Secondary | ICD-10-CM | POA: Diagnosis not present

## 2023-08-27 DIAGNOSIS — D509 Iron deficiency anemia, unspecified: Secondary | ICD-10-CM | POA: Diagnosis not present

## 2023-08-27 DIAGNOSIS — E875 Hyperkalemia: Secondary | ICD-10-CM | POA: Diagnosis not present

## 2023-08-27 DIAGNOSIS — E1122 Type 2 diabetes mellitus with diabetic chronic kidney disease: Secondary | ICD-10-CM | POA: Diagnosis not present

## 2023-08-27 DIAGNOSIS — R52 Pain, unspecified: Secondary | ICD-10-CM | POA: Diagnosis not present

## 2023-08-27 DIAGNOSIS — R5381 Other malaise: Secondary | ICD-10-CM | POA: Diagnosis not present

## 2023-08-27 DIAGNOSIS — N2581 Secondary hyperparathyroidism of renal origin: Secondary | ICD-10-CM | POA: Diagnosis not present

## 2023-08-27 DIAGNOSIS — D631 Anemia in chronic kidney disease: Secondary | ICD-10-CM | POA: Diagnosis not present

## 2023-08-27 DIAGNOSIS — N186 End stage renal disease: Secondary | ICD-10-CM | POA: Diagnosis not present

## 2023-08-28 DIAGNOSIS — R2689 Other abnormalities of gait and mobility: Secondary | ICD-10-CM | POA: Diagnosis not present

## 2023-08-28 DIAGNOSIS — N186 End stage renal disease: Secondary | ICD-10-CM | POA: Diagnosis not present

## 2023-08-28 DIAGNOSIS — M6281 Muscle weakness (generalized): Secondary | ICD-10-CM | POA: Diagnosis not present

## 2023-08-28 DIAGNOSIS — S86811D Strain of other muscle(s) and tendon(s) at lower leg level, right leg, subsequent encounter: Secondary | ICD-10-CM | POA: Diagnosis not present

## 2023-08-29 DIAGNOSIS — N2581 Secondary hyperparathyroidism of renal origin: Secondary | ICD-10-CM | POA: Diagnosis not present

## 2023-08-29 DIAGNOSIS — R2689 Other abnormalities of gait and mobility: Secondary | ICD-10-CM | POA: Diagnosis not present

## 2023-08-29 DIAGNOSIS — N186 End stage renal disease: Secondary | ICD-10-CM | POA: Diagnosis not present

## 2023-08-29 DIAGNOSIS — Z992 Dependence on renal dialysis: Secondary | ICD-10-CM | POA: Diagnosis not present

## 2023-08-29 DIAGNOSIS — M6281 Muscle weakness (generalized): Secondary | ICD-10-CM | POA: Diagnosis not present

## 2023-08-29 DIAGNOSIS — S86811D Strain of other muscle(s) and tendon(s) at lower leg level, right leg, subsequent encounter: Secondary | ICD-10-CM | POA: Diagnosis not present

## 2023-08-29 DIAGNOSIS — R52 Pain, unspecified: Secondary | ICD-10-CM | POA: Diagnosis not present

## 2023-08-29 DIAGNOSIS — D631 Anemia in chronic kidney disease: Secondary | ICD-10-CM | POA: Diagnosis not present

## 2023-08-29 DIAGNOSIS — D509 Iron deficiency anemia, unspecified: Secondary | ICD-10-CM | POA: Diagnosis not present

## 2023-08-29 DIAGNOSIS — E875 Hyperkalemia: Secondary | ICD-10-CM | POA: Diagnosis not present

## 2023-08-29 DIAGNOSIS — E1122 Type 2 diabetes mellitus with diabetic chronic kidney disease: Secondary | ICD-10-CM | POA: Diagnosis not present

## 2023-08-30 DIAGNOSIS — R2689 Other abnormalities of gait and mobility: Secondary | ICD-10-CM | POA: Diagnosis not present

## 2023-08-30 DIAGNOSIS — S86811D Strain of other muscle(s) and tendon(s) at lower leg level, right leg, subsequent encounter: Secondary | ICD-10-CM | POA: Diagnosis not present

## 2023-08-30 DIAGNOSIS — N186 End stage renal disease: Secondary | ICD-10-CM | POA: Diagnosis not present

## 2023-08-30 DIAGNOSIS — M6281 Muscle weakness (generalized): Secondary | ICD-10-CM | POA: Diagnosis not present

## 2023-09-01 DIAGNOSIS — E1122 Type 2 diabetes mellitus with diabetic chronic kidney disease: Secondary | ICD-10-CM | POA: Diagnosis not present

## 2023-09-01 DIAGNOSIS — Z992 Dependence on renal dialysis: Secondary | ICD-10-CM | POA: Diagnosis not present

## 2023-09-01 DIAGNOSIS — N2581 Secondary hyperparathyroidism of renal origin: Secondary | ICD-10-CM | POA: Diagnosis not present

## 2023-09-01 DIAGNOSIS — D631 Anemia in chronic kidney disease: Secondary | ICD-10-CM | POA: Diagnosis not present

## 2023-09-01 DIAGNOSIS — S86811D Strain of other muscle(s) and tendon(s) at lower leg level, right leg, subsequent encounter: Secondary | ICD-10-CM | POA: Diagnosis not present

## 2023-09-01 DIAGNOSIS — R52 Pain, unspecified: Secondary | ICD-10-CM | POA: Diagnosis not present

## 2023-09-01 DIAGNOSIS — N186 End stage renal disease: Secondary | ICD-10-CM | POA: Diagnosis not present

## 2023-09-01 DIAGNOSIS — R2689 Other abnormalities of gait and mobility: Secondary | ICD-10-CM | POA: Diagnosis not present

## 2023-09-01 DIAGNOSIS — R5381 Other malaise: Secondary | ICD-10-CM | POA: Diagnosis not present

## 2023-09-01 DIAGNOSIS — M6281 Muscle weakness (generalized): Secondary | ICD-10-CM | POA: Diagnosis not present

## 2023-09-01 DIAGNOSIS — E875 Hyperkalemia: Secondary | ICD-10-CM | POA: Diagnosis not present

## 2023-09-01 DIAGNOSIS — D509 Iron deficiency anemia, unspecified: Secondary | ICD-10-CM | POA: Diagnosis not present

## 2023-09-03 ENCOUNTER — Institutional Professional Consult (permissible substitution): Payer: Medicare Other | Admitting: Internal Medicine

## 2023-09-03 DIAGNOSIS — N186 End stage renal disease: Secondary | ICD-10-CM | POA: Diagnosis not present

## 2023-09-03 DIAGNOSIS — R52 Pain, unspecified: Secondary | ICD-10-CM | POA: Diagnosis not present

## 2023-09-03 DIAGNOSIS — D509 Iron deficiency anemia, unspecified: Secondary | ICD-10-CM | POA: Diagnosis not present

## 2023-09-03 DIAGNOSIS — E875 Hyperkalemia: Secondary | ICD-10-CM | POA: Diagnosis not present

## 2023-09-03 DIAGNOSIS — N2581 Secondary hyperparathyroidism of renal origin: Secondary | ICD-10-CM | POA: Diagnosis not present

## 2023-09-03 DIAGNOSIS — Z992 Dependence on renal dialysis: Secondary | ICD-10-CM | POA: Diagnosis not present

## 2023-09-03 DIAGNOSIS — E1122 Type 2 diabetes mellitus with diabetic chronic kidney disease: Secondary | ICD-10-CM | POA: Diagnosis not present

## 2023-09-03 DIAGNOSIS — D631 Anemia in chronic kidney disease: Secondary | ICD-10-CM | POA: Diagnosis not present

## 2023-09-04 DIAGNOSIS — M6281 Muscle weakness (generalized): Secondary | ICD-10-CM | POA: Diagnosis not present

## 2023-09-04 DIAGNOSIS — S86811D Strain of other muscle(s) and tendon(s) at lower leg level, right leg, subsequent encounter: Secondary | ICD-10-CM | POA: Diagnosis not present

## 2023-09-04 DIAGNOSIS — N186 End stage renal disease: Secondary | ICD-10-CM | POA: Diagnosis not present

## 2023-09-04 DIAGNOSIS — R2689 Other abnormalities of gait and mobility: Secondary | ICD-10-CM | POA: Diagnosis not present

## 2023-09-05 DIAGNOSIS — N2581 Secondary hyperparathyroidism of renal origin: Secondary | ICD-10-CM | POA: Diagnosis not present

## 2023-09-05 DIAGNOSIS — Z992 Dependence on renal dialysis: Secondary | ICD-10-CM | POA: Diagnosis not present

## 2023-09-05 DIAGNOSIS — S86811D Strain of other muscle(s) and tendon(s) at lower leg level, right leg, subsequent encounter: Secondary | ICD-10-CM | POA: Diagnosis not present

## 2023-09-05 DIAGNOSIS — D509 Iron deficiency anemia, unspecified: Secondary | ICD-10-CM | POA: Diagnosis not present

## 2023-09-05 DIAGNOSIS — R2689 Other abnormalities of gait and mobility: Secondary | ICD-10-CM | POA: Diagnosis not present

## 2023-09-05 DIAGNOSIS — N186 End stage renal disease: Secondary | ICD-10-CM | POA: Diagnosis not present

## 2023-09-05 DIAGNOSIS — E875 Hyperkalemia: Secondary | ICD-10-CM | POA: Diagnosis not present

## 2023-09-05 DIAGNOSIS — M6281 Muscle weakness (generalized): Secondary | ICD-10-CM | POA: Diagnosis not present

## 2023-09-05 DIAGNOSIS — E1122 Type 2 diabetes mellitus with diabetic chronic kidney disease: Secondary | ICD-10-CM | POA: Diagnosis not present

## 2023-09-05 DIAGNOSIS — R52 Pain, unspecified: Secondary | ICD-10-CM | POA: Diagnosis not present

## 2023-09-05 DIAGNOSIS — D631 Anemia in chronic kidney disease: Secondary | ICD-10-CM | POA: Diagnosis not present

## 2023-09-07 DIAGNOSIS — S86811D Strain of other muscle(s) and tendon(s) at lower leg level, right leg, subsequent encounter: Secondary | ICD-10-CM | POA: Diagnosis not present

## 2023-09-07 DIAGNOSIS — R2689 Other abnormalities of gait and mobility: Secondary | ICD-10-CM | POA: Diagnosis not present

## 2023-09-07 DIAGNOSIS — Z992 Dependence on renal dialysis: Secondary | ICD-10-CM | POA: Diagnosis not present

## 2023-09-07 DIAGNOSIS — M6281 Muscle weakness (generalized): Secondary | ICD-10-CM | POA: Diagnosis not present

## 2023-09-07 DIAGNOSIS — N186 End stage renal disease: Secondary | ICD-10-CM | POA: Diagnosis not present

## 2023-09-07 DIAGNOSIS — E875 Hyperkalemia: Secondary | ICD-10-CM | POA: Diagnosis not present

## 2023-09-07 DIAGNOSIS — N2581 Secondary hyperparathyroidism of renal origin: Secondary | ICD-10-CM | POA: Diagnosis not present

## 2023-09-07 DIAGNOSIS — E1122 Type 2 diabetes mellitus with diabetic chronic kidney disease: Secondary | ICD-10-CM | POA: Diagnosis not present

## 2023-09-07 DIAGNOSIS — D631 Anemia in chronic kidney disease: Secondary | ICD-10-CM | POA: Diagnosis not present

## 2023-09-07 DIAGNOSIS — D509 Iron deficiency anemia, unspecified: Secondary | ICD-10-CM | POA: Diagnosis not present

## 2023-09-07 DIAGNOSIS — R52 Pain, unspecified: Secondary | ICD-10-CM | POA: Diagnosis not present

## 2023-09-08 DIAGNOSIS — N186 End stage renal disease: Secondary | ICD-10-CM | POA: Diagnosis not present

## 2023-09-08 DIAGNOSIS — S86811D Strain of other muscle(s) and tendon(s) at lower leg level, right leg, subsequent encounter: Secondary | ICD-10-CM | POA: Diagnosis not present

## 2023-09-08 DIAGNOSIS — M6281 Muscle weakness (generalized): Secondary | ICD-10-CM | POA: Diagnosis not present

## 2023-09-08 DIAGNOSIS — R2689 Other abnormalities of gait and mobility: Secondary | ICD-10-CM | POA: Diagnosis not present

## 2023-09-09 DIAGNOSIS — S86811D Strain of other muscle(s) and tendon(s) at lower leg level, right leg, subsequent encounter: Secondary | ICD-10-CM | POA: Diagnosis not present

## 2023-09-09 DIAGNOSIS — M6281 Muscle weakness (generalized): Secondary | ICD-10-CM | POA: Diagnosis not present

## 2023-09-09 DIAGNOSIS — Z992 Dependence on renal dialysis: Secondary | ICD-10-CM | POA: Diagnosis not present

## 2023-09-09 DIAGNOSIS — D509 Iron deficiency anemia, unspecified: Secondary | ICD-10-CM | POA: Diagnosis not present

## 2023-09-09 DIAGNOSIS — N186 End stage renal disease: Secondary | ICD-10-CM | POA: Diagnosis not present

## 2023-09-09 DIAGNOSIS — D631 Anemia in chronic kidney disease: Secondary | ICD-10-CM | POA: Diagnosis not present

## 2023-09-09 DIAGNOSIS — E875 Hyperkalemia: Secondary | ICD-10-CM | POA: Diagnosis not present

## 2023-09-09 DIAGNOSIS — N2581 Secondary hyperparathyroidism of renal origin: Secondary | ICD-10-CM | POA: Diagnosis not present

## 2023-09-09 DIAGNOSIS — E1122 Type 2 diabetes mellitus with diabetic chronic kidney disease: Secondary | ICD-10-CM | POA: Diagnosis not present

## 2023-09-09 DIAGNOSIS — R2689 Other abnormalities of gait and mobility: Secondary | ICD-10-CM | POA: Diagnosis not present

## 2023-09-09 DIAGNOSIS — R52 Pain, unspecified: Secondary | ICD-10-CM | POA: Diagnosis not present

## 2023-09-10 DIAGNOSIS — S86811D Strain of other muscle(s) and tendon(s) at lower leg level, right leg, subsequent encounter: Secondary | ICD-10-CM | POA: Diagnosis not present

## 2023-09-10 DIAGNOSIS — R2689 Other abnormalities of gait and mobility: Secondary | ICD-10-CM | POA: Diagnosis not present

## 2023-09-10 DIAGNOSIS — N186 End stage renal disease: Secondary | ICD-10-CM | POA: Diagnosis not present

## 2023-09-10 DIAGNOSIS — M6281 Muscle weakness (generalized): Secondary | ICD-10-CM | POA: Diagnosis not present

## 2023-09-12 DIAGNOSIS — Z992 Dependence on renal dialysis: Secondary | ICD-10-CM | POA: Diagnosis not present

## 2023-09-12 DIAGNOSIS — E1122 Type 2 diabetes mellitus with diabetic chronic kidney disease: Secondary | ICD-10-CM | POA: Diagnosis not present

## 2023-09-12 DIAGNOSIS — E875 Hyperkalemia: Secondary | ICD-10-CM | POA: Diagnosis not present

## 2023-09-12 DIAGNOSIS — S86811D Strain of other muscle(s) and tendon(s) at lower leg level, right leg, subsequent encounter: Secondary | ICD-10-CM | POA: Diagnosis not present

## 2023-09-12 DIAGNOSIS — R52 Pain, unspecified: Secondary | ICD-10-CM | POA: Diagnosis not present

## 2023-09-12 DIAGNOSIS — R2689 Other abnormalities of gait and mobility: Secondary | ICD-10-CM | POA: Diagnosis not present

## 2023-09-12 DIAGNOSIS — D631 Anemia in chronic kidney disease: Secondary | ICD-10-CM | POA: Diagnosis not present

## 2023-09-12 DIAGNOSIS — N2581 Secondary hyperparathyroidism of renal origin: Secondary | ICD-10-CM | POA: Diagnosis not present

## 2023-09-12 DIAGNOSIS — N186 End stage renal disease: Secondary | ICD-10-CM | POA: Diagnosis not present

## 2023-09-12 DIAGNOSIS — M6281 Muscle weakness (generalized): Secondary | ICD-10-CM | POA: Diagnosis not present

## 2023-09-12 DIAGNOSIS — D509 Iron deficiency anemia, unspecified: Secondary | ICD-10-CM | POA: Diagnosis not present

## 2023-09-13 DIAGNOSIS — Z992 Dependence on renal dialysis: Secondary | ICD-10-CM | POA: Diagnosis not present

## 2023-09-13 DIAGNOSIS — N186 End stage renal disease: Secondary | ICD-10-CM | POA: Diagnosis not present

## 2023-09-15 DIAGNOSIS — N2581 Secondary hyperparathyroidism of renal origin: Secondary | ICD-10-CM | POA: Diagnosis not present

## 2023-09-15 DIAGNOSIS — N186 End stage renal disease: Secondary | ICD-10-CM | POA: Diagnosis not present

## 2023-09-15 DIAGNOSIS — R2689 Other abnormalities of gait and mobility: Secondary | ICD-10-CM | POA: Diagnosis not present

## 2023-09-15 DIAGNOSIS — Z992 Dependence on renal dialysis: Secondary | ICD-10-CM | POA: Diagnosis not present

## 2023-09-15 DIAGNOSIS — M6281 Muscle weakness (generalized): Secondary | ICD-10-CM | POA: Diagnosis not present

## 2023-09-15 DIAGNOSIS — S86811D Strain of other muscle(s) and tendon(s) at lower leg level, right leg, subsequent encounter: Secondary | ICD-10-CM | POA: Diagnosis not present

## 2023-09-16 DIAGNOSIS — R000 Tachycardia, unspecified: Secondary | ICD-10-CM | POA: Diagnosis not present

## 2023-09-16 DIAGNOSIS — N186 End stage renal disease: Secondary | ICD-10-CM | POA: Diagnosis not present

## 2023-09-16 DIAGNOSIS — J9602 Acute respiratory failure with hypercapnia: Secondary | ICD-10-CM | POA: Diagnosis not present

## 2023-09-16 DIAGNOSIS — I4430 Unspecified atrioventricular block: Secondary | ICD-10-CM | POA: Diagnosis not present

## 2023-09-16 DIAGNOSIS — I959 Hypotension, unspecified: Secondary | ICD-10-CM | POA: Diagnosis not present

## 2023-09-16 DIAGNOSIS — R0902 Hypoxemia: Secondary | ICD-10-CM | POA: Diagnosis not present

## 2023-09-16 DIAGNOSIS — R9431 Abnormal electrocardiogram [ECG] [EKG]: Secondary | ICD-10-CM | POA: Diagnosis not present

## 2023-09-16 DIAGNOSIS — E875 Hyperkalemia: Secondary | ICD-10-CM | POA: Diagnosis not present

## 2023-09-16 DIAGNOSIS — E872 Acidosis, unspecified: Secondary | ICD-10-CM | POA: Diagnosis not present

## 2023-09-16 DIAGNOSIS — I440 Atrioventricular block, first degree: Secondary | ICD-10-CM | POA: Diagnosis not present

## 2023-10-23 ENCOUNTER — Institutional Professional Consult (permissible substitution): Payer: Medicaid Other | Admitting: Internal Medicine
